# Patient Record
Sex: Female | Born: 1985 | Race: White | Hispanic: No | Smoking: Former smoker
Health system: Southern US, Community
[De-identification: ages and names within clinical notes are randomized; demographics above are authoritative.]

## PROBLEM LIST (undated history)

## (undated) HISTORY — PX: WISDOM TOOTH EXTRACTION: SHX21

---

## 2013-07-11 ENCOUNTER — Ambulatory Visit (INDEPENDENT_AMBULATORY_CARE_PROVIDER_SITE_OTHER): Payer: PRIVATE HEALTH INSURANCE | Admitting: Advanced Practice Midwife

## 2013-07-11 ENCOUNTER — Encounter: Payer: Self-pay | Admitting: Advanced Practice Midwife

## 2013-07-11 VITALS — BP 135/84 | Ht 66.0 in | Wt 149.0 lb

## 2013-07-11 DIAGNOSIS — Z124 Encounter for screening for malignant neoplasm of cervix: Secondary | ICD-10-CM

## 2013-07-11 DIAGNOSIS — O209 Hemorrhage in early pregnancy, unspecified: Secondary | ICD-10-CM

## 2013-07-11 DIAGNOSIS — Z113 Encounter for screening for infections with a predominantly sexual mode of transmission: Secondary | ICD-10-CM

## 2013-07-11 DIAGNOSIS — Z3400 Encounter for supervision of normal first pregnancy, unspecified trimester: Secondary | ICD-10-CM

## 2013-07-11 DIAGNOSIS — Z1151 Encounter for screening for human papillomavirus (HPV): Secondary | ICD-10-CM

## 2013-07-11 DIAGNOSIS — Z3401 Encounter for supervision of normal first pregnancy, first trimester: Secondary | ICD-10-CM

## 2013-07-11 NOTE — Progress Notes (Signed)
New OB.  Routines reviewed. OK with residents. See other note'  Subjective:    Meghan Delacruz is a G1P0 [redacted]w[redacted]d being seen today for her first obstetrical visit.  Her obstetrical history is significant for first trimerster spotting. Patient does intend to breast feed.  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 Pregnancy history fully reviewed.  Patient reports bleeding and no cramping.  Filed Vitals:   07/11/13 1721 07/11/13 1827  BP: 135/84   Height:  5\' 6"  (1.676 m)  Weight: 149 lb (67.586 kg)     HISTORY: OB History  Gravida Para Term Preterm AB SAB TAB Ectopic Multiple Living  1             # Outcome Date GA Lbr Len/2nd Weight Sex Delivery Anes PTL Lv  1 CUR              History reviewed. No pertinent past medical history. Past Surgical History  Procedure Laterality Date  . Wisdom tooth extraction     Family History  Problem Relation Age of Onset  . Mitral valve prolapse Mother   . Depression Mother   . Depression Father   . Asthma Father   . Asthma Sister   . Depression Sister     Post partum  . Chronic bronchitis Father   . Cancer - Lung Paternal Grandfather      Exam    Uterus:  Fundal Height: 5 cm  Pelvic Exam:    Perineum: No Hemorrhoids   Vulva: Bartholin's, Urethra, Skene's normal   Vagina:  normal discharge, scant blood   pH:    Cervix: nulliparous appearance   Adnexa: normal adnexa and no mass, fullness, tenderness   Bony Pelvis: gynecoid  System: Breast:  normal appearance, no masses or tenderness   Skin: normal coloration and turgor, no rashes    Neurologic: oriented, grossly non-focal   Extremities: normal strength, tone, and muscle mass   HEENT neck supple with midline trachea   Mouth/Teeth mucous membranes moist, pharynx normal without lesions   Neck supple and no masses   Cardiovascular: regular rate and rhythm, no murmurs or gallops   Respiratory:  appears well, vitals normal, no respiratory distress, acyanotic, normal RR, ear  and throat exam is normal, neck free of mass or lymphadenopathy, chest clear, no wheezing, crepitations, rhonchi, normal symmetric air entry   Abdomen: soft, non-tender; bowel sounds normal; no masses,  no organomegaly   Urinary: urethral meatus normal      Assessment:    Pregnancy: G1P0 Patient Active Problem List   Diagnosis Date Noted  . First trimester bleeding 07/11/2013        Plan:     Initial labs drawn. Prenatal vitamins. Problem list reviewed and updated. Genetic Screening discussed First Screen: requested.  Ultrasound discussed; fetal survey: requested.  Follow up in 1 weeks. 75% of 30 min visit spent on counseling and coordination of care.  Plan to repeat US per Lora in 7-10 days for viability.  OB panel done today   [facility] 07/11/2013

## 2013-07-11 NOTE — Patient Instructions (Signed)
Pregnancy - First Trimester  During sexual intercourse, millions of sperm go into the vagina. Only 1 sperm will penetrate and fertilize the female egg while it is in the Fallopian tube. One week later, the fertilized egg implants into the wall of the uterus. An embryo begins to develop into a baby. At 6 to 8 weeks, the eyes and face are formed and the heartbeat can be seen on ultrasound. At the end of 12 weeks (first trimester), all the baby's organs are formed. Now that you are pregnant, you will want to do everything you can to have a healthy baby. Two of the most important things are to get good prenatal care and follow your caregiver's instructions. Prenatal care is all the medical care you receive before the baby's birth. It is given to prevent, find, and treat problems during the pregnancy and childbirth.  PRENATAL EXAMS  · During prenatal visits, your weight, blood pressure, and urine are checked. This is done to make sure you are healthy and progressing normally during the pregnancy.  · A pregnant woman should gain 25 to 35 pounds during the pregnancy. However, if you are overweight or underweight, your caregiver will advise you regarding your weight.  · Your caregiver will ask and answer questions for you.  · Blood work, cervical cultures, other necessary tests, and a Pap test are done during your prenatal exams. These tests are done to check on your health and the probable health of your baby. Tests are strongly recommended and done for HIV with your permission. This is the virus that causes AIDS. These tests are done because medicines can be given to help prevent your baby from being born with this infection should you have been infected without knowing it. Blood work is also used to find out your blood type, previous infections, and follow your blood levels (hemoglobin).  · Low hemoglobin (anemia) is common during pregnancy. Iron and vitamins are given to help prevent this. Later in the pregnancy, blood  tests for diabetes will be done along with any other tests if any problems develop.  · You may need other tests to make sure you and the baby are doing well.  CHANGES DURING THE FIRST TRIMESTER   Your body goes through many changes during pregnancy. They vary from person to person. Talk to your caregiver about changes you notice and are concerned about. Changes can include:  · Your menstrual period stops.  · The egg and sperm carry the genes that determine what you look like. Genes from you and your partner are forming a baby. The female genes determine whether the baby is a boy or a girl.  · Your body increases in girth and you may feel bloated.  · Feeling sick to your stomach (nauseous) and throwing up (vomiting). If the vomiting is uncontrollable, call your caregiver.  · Your breasts will begin to enlarge and become tender.  · Your nipples may stick out more and become darker.  · The need to urinate more. Painful urination may mean you have a bladder infection.  · Tiring easily.  · Loss of appetite.  · Cravings for certain kinds of food.  · At first, you may gain or lose a couple of pounds.  · You may have changes in your emotions from day to day (excited to be pregnant or concerned something may go wrong with the pregnancy and baby).  · You may have more vivid and strange dreams.  HOME CARE INSTRUCTIONS   ·   It is very important to avoid all smoking, alcohol and non-prescribed drugs during your pregnancy. These affect the formation and growth of the baby. Avoid chemicals while pregnant to ensure the delivery of a healthy infant.  · Start your prenatal visits by the 12th week of pregnancy. They are usually scheduled monthly at first, then more often in the last 2 months before delivery. Keep your caregiver's appointments. Follow your caregiver's instructions regarding medicine use, blood and lab tests, exercise, and diet.  · During pregnancy, you are providing food for you and your baby. Eat regular, well-balanced  meals. Choose foods such as meat, fish, milk and other low fat dairy products, vegetables, fruits, and whole-grain breads and cereals. Your caregiver will tell you of the ideal weight gain.  · You can help morning sickness by keeping soda crackers at the bedside. Eat a couple before arising in the morning. You may want to use the crackers without salt on them.  · Eating 4 to 5 small meals rather than 3 large meals a day also may help the nausea and vomiting.  · Drinking liquids between meals instead of during meals also seems to help nausea and vomiting.  · A physical sexual relationship may be continued throughout pregnancy if there are no other problems. Problems may be early (premature) leaking of amniotic fluid from the membranes, vaginal bleeding, or belly (abdominal) pain.  · Exercise regularly if there are no restrictions. Check with your caregiver or physical therapist if you are unsure of the safety of some of your exercises. Greater weight gain will occur in the last 2 trimesters of pregnancy. Exercising will help:  · Control your weight.  · Keep you in shape.  · Prepare you for labor and delivery.  · Help you lose your pregnancy weight after you deliver your baby.  · Wear a good support or jogging bra for breast tenderness during pregnancy. This may help if worn during sleep too.  · Ask when prenatal classes are available. Begin classes when they are offered.  · Do not use hot tubs, steam rooms, or saunas.  · Wear your seat belt when driving. This protects you and your baby if you are in an accident.  · Avoid raw meat, uncooked cheese, cat litter boxes, and soil used by cats throughout the pregnancy. These carry germs that can cause birth defects in the baby.  · The first trimester is a good time to visit your dentist for your dental health. Getting your teeth cleaned is okay. Use a softer toothbrush and brush gently during pregnancy.  · Ask for help if you have financial, counseling, or nutritional needs  during pregnancy. Your caregiver will be able to offer counseling for these needs as well as refer you for other special needs.  · Do not take any medicines or herbs unless told by your caregiver.  · Inform your caregiver if there is any mental or physical domestic violence.  · Make a list of emergency phone numbers of family, friends, hospital, and police and fire departments.  · Write down your questions. Take them to your prenatal visit.  · Do not douche.  · Do not cross your legs.  · If you have to stand for long periods of time, rotate you feet or take small steps in a circle.  · You may have more vaginal secretions that may require a sanitary pad. Do not use tampons or scented sanitary pads.  MEDICINES AND DRUG USE IN PREGNANCY  ·   Take prenatal vitamins as directed. The vitamin should contain 1 milligram of folic acid. Keep all vitamins out of reach of children. Only a couple vitamins or tablets containing iron may be fatal to a baby or young child when ingested.  · Avoid use of all medicines, including herbs, over-the-counter medicines, not prescribed or suggested by your caregiver. Only take over-the-counter or prescription medicines for pain, discomfort, or fever as directed by your caregiver. Do not use aspirin, ibuprofen, or naproxen unless directed by your caregiver.  · Let your caregiver also know about herbs you may be using.  · Alcohol is related to a number of birth defects. This includes fetal alcohol syndrome. All alcohol, in any form, should be avoided completely. Smoking will cause low birth rate and premature babies.  · Street or illegal drugs are very harmful to the baby. They are absolutely forbidden. A baby born to an addicted mother will be addicted at birth. The baby will go through the same withdrawal an adult does.  · Let your caregiver know about any medicines that you have to take and for what reason you take them.  SEEK MEDICAL CARE IF:   You have any concerns or worries during your  pregnancy. It is better to call with your questions if you feel they cannot wait, rather than worry about them.  SEEK IMMEDIATE MEDICAL CARE IF:   · An unexplained oral temperature above 102° F (38.9° C) develops, or as your caregiver suggests.  · You have leaking of fluid from the vagina (birth canal). If leaking membranes are suspected, take your temperature and inform your caregiver of this when you call.  · There is vaginal spotting or bleeding. Notify your caregiver of the amount and how many pads are used.  · You develop a bad smelling vaginal discharge with a change in the color.  · You continue to feel sick to your stomach (nauseated) and have no relief from remedies suggested. You vomit blood or coffee ground-like materials.  · You lose more than 2 pounds of weight in 1 week.  · You gain more than 2 pounds of weight in 1 week and you notice swelling of your face, hands, feet, or legs.  · You gain 5 pounds or more in 1 week (even if you do not have swelling of your hands, face, legs, or feet).  · You get exposed to German measles and have never had them.  · You are exposed to fifth disease or chickenpox.  · You develop belly (abdominal) pain. Round ligament discomfort is a common non-cancerous (benign) cause of abdominal pain in pregnancy. Your caregiver still must evaluate this.  · You develop headache, fever, diarrhea, pain with urination, or shortness of breath.  · You fall or are in a car accident or have any kind of trauma.  · There is mental or physical violence in your home.  Document Released: 08/17/2001 Document Revised: 05/17/2012 Document Reviewed: 02/18/2009  ExitCare® Patient Information ©2014 [employer].

## 2013-07-11 NOTE — Progress Notes (Signed)
p-106  Bedside U/S showed IUP with fetal pole of 2.3 mm and GA of [redacted]w[redacted]d.  No FHT seen.  She does have a small subchorionic hemorrhage.

## 2013-07-12 LAB — OBSTETRIC PANEL
Antibody Screen: NEGATIVE
Eosinophils Absolute: 0.2 10*3/uL (ref 0.0–0.7)
Hemoglobin: 13.2 g/dL (ref 12.0–15.0)
Hepatitis B Surface Ag: NEGATIVE
Lymphs Abs: 1.9 10*3/uL (ref 0.7–4.0)
MCH: 30.6 pg (ref 26.0–34.0)
Monocytes Absolute: 0.8 10*3/uL (ref 0.1–1.0)
Monocytes Relative: 9 % (ref 3–12)
Neutrophils Relative %: 66 % (ref 43–77)
RBC: 4.31 MIL/uL (ref 3.87–5.11)
RDW: 12.5 % (ref 11.5–15.5)
Rh Type: POSITIVE
Rubella: 6.14 Index — ABNORMAL HIGH (ref <0.90)
WBC: 8.8 10*3/uL (ref 4.0–10.5)

## 2013-07-12 LAB — HIV ANTIBODY (ROUTINE TESTING W REFLEX): HIV: NONREACTIVE

## 2013-07-12 LAB — URINE CULTURE
Colony Count: NO GROWTH
Organism ID, Bacteria: NO GROWTH

## 2013-07-14 ENCOUNTER — Encounter: Payer: Self-pay | Admitting: Advanced Practice Midwife

## 2013-07-14 DIAGNOSIS — B977 Papillomavirus as the cause of diseases classified elsewhere: Secondary | ICD-10-CM | POA: Insufficient documentation

## 2013-07-20 ENCOUNTER — Encounter: Payer: Self-pay | Admitting: Family

## 2013-07-20 ENCOUNTER — Ambulatory Visit (INDEPENDENT_AMBULATORY_CARE_PROVIDER_SITE_OTHER): Payer: PRIVATE HEALTH INSURANCE | Admitting: Family

## 2013-07-20 VITALS — BP 139/88 | Temp 97.9°F

## 2013-07-20 DIAGNOSIS — Z3491 Encounter for supervision of normal pregnancy, unspecified, first trimester: Secondary | ICD-10-CM

## 2013-07-20 DIAGNOSIS — Z3400 Encounter for supervision of normal first pregnancy, unspecified trimester: Secondary | ICD-10-CM

## 2013-07-20 NOTE — Progress Notes (Signed)
p-109  Bedside U/S showed viable IUP  With FHT of 124.

## 2013-07-20 NOTE — Progress Notes (Signed)
Nurse only visit

## 2013-08-23 ENCOUNTER — Ambulatory Visit (INDEPENDENT_AMBULATORY_CARE_PROVIDER_SITE_OTHER): Payer: PRIVATE HEALTH INSURANCE | Admitting: Obstetrics & Gynecology

## 2013-08-23 VITALS — BP 126/80 | Wt 151.0 lb

## 2013-08-23 DIAGNOSIS — Z3491 Encounter for supervision of normal pregnancy, unspecified, first trimester: Secondary | ICD-10-CM

## 2013-08-23 DIAGNOSIS — Z23 Encounter for immunization: Secondary | ICD-10-CM

## 2013-08-23 DIAGNOSIS — Z3480 Encounter for supervision of other normal pregnancy, unspecified trimester: Secondary | ICD-10-CM

## 2013-08-23 MED ORDER — INFLUENZA VAC SPLIT QUAD 0.5 ML IM SUSP: 0.5000 mL | Freq: Once | INTRAMUSCULAR | Status: DC

## 2013-08-23 NOTE — Progress Notes (Signed)
p-123

## 2013-08-23 NOTE — Progress Notes (Signed)
No problems.  Mild tiredness.  No nausea.  First screen ordered. Flu shot.

## 2013-09-06 NOTE — L&D Delivery Note (Signed)
Delivery Note At 7:03 PM a viable female was delivered via Vaginal, Spontaneous Delivery (Presentation: Left Occiput Anterior).  APGAR: 8, 9; weight pending .   Placenta status: Intact, Spontaneous.  Cord: 3 vessels with the following complications: None.    Anesthesia: None Episiotomy: None Lacerations: Periurethral;Sulcus second degree Suture Repair: 3.0 vicryl Est. Blood Loss (mL): 350  Mom to postpartum.  Baby to Couplet care / Skin to Skin.  Called to delivery. Mother pushed over 20 minutes. Infant delivered to maternal abdomen. Cord clamped and cut. Active management of 3rd stage with traction. Placenta delivered intact with 3v cord. Second degree tear repaired with 3.0 monocryl on CT in usual manner. [id number]. Counts correct. Hemostatic.   Yolande Jollyaleb G Kynnedi Zweig, MD Resident    Gwenn Teodoro, Hillery Hunteraleb G 03/09/2014, 8:16 PM

## 2013-09-21 ENCOUNTER — Encounter: Payer: Self-pay | Admitting: Obstetrics and Gynecology

## 2013-09-21 ENCOUNTER — Ambulatory Visit (INDEPENDENT_AMBULATORY_CARE_PROVIDER_SITE_OTHER): Payer: PRIVATE HEALTH INSURANCE | Admitting: Obstetrics and Gynecology

## 2013-09-21 VITALS — BP 133/88 | Wt 152.0 lb

## 2013-09-21 DIAGNOSIS — Z3402 Encounter for supervision of normal first pregnancy, second trimester: Secondary | ICD-10-CM

## 2013-09-21 DIAGNOSIS — Z3400 Encounter for supervision of normal first pregnancy, unspecified trimester: Secondary | ICD-10-CM

## 2013-09-21 NOTE — Progress Notes (Signed)
p - 111

## 2013-09-21 NOTE — Patient Instructions (Signed)
Second Trimester of Pregnancy The second trimester is from week 13 through week 28, months 4 through 6. The second trimester is often a time when you feel your best. Your body has also adjusted to being pregnant, and you begin to feel better physically. Usually, morning sickness has lessened or quit completely, you may have more energy, and you may have an increase in appetite. The second trimester is also a time when the fetus is growing rapidly. At the end of the sixth month, the fetus is about 9 inches long and weighs about 1 pounds. You will likely begin to feel the baby move (quickening) between 18 and 20 weeks of the pregnancy. BODY CHANGES Your body goes through many changes during pregnancy. The changes vary from woman to woman.   Your weight will continue to increase. You will notice your lower abdomen bulging out.  You may begin to get stretch marks on your hips, abdomen, and breasts.  You may develop headaches that can be relieved by medicines approved by your caregiver.  You may urinate more often because the fetus is pressing on your bladder.  You may develop or continue to have heartburn as a result of your pregnancy.  You may develop constipation because certain hormones are causing the muscles that push waste through your intestines to slow down.  You may develop hemorrhoids or swollen, bulging veins (varicose veins).  You may have back pain because of the weight gain and pregnancy hormones relaxing your joints between the bones in your pelvis and as a result of a shift in weight and the muscles that support your balance.  Your breasts will continue to grow and be tender.  Your gums may bleed and may be sensitive to brushing and flossing.  Dark spots or blotches (chloasma, mask of pregnancy) may develop on your face. This will likely fade after the baby is born.  A dark line from your belly button to the pubic area (linea nigra) may appear. This will likely fade after the  baby is born. WHAT TO EXPECT AT YOUR PRENATAL VISITS During a routine prenatal visit:  You will be weighed to make sure you and the fetus are growing normally.  Your blood pressure will be taken.  Your abdomen will be measured to track your baby's growth.  The fetal heartbeat will be listened to.  Any test results from the previous visit will be discussed. Your caregiver may ask you:  How you are feeling.  If you are feeling the baby move.  If you have had any abnormal symptoms, such as leaking fluid, bleeding, severe headaches, or abdominal cramping.  If you have any questions. Other tests that may be performed during your second trimester include:  Blood tests that check for:  Low iron levels (anemia).  Gestational diabetes (between 24 and 28 weeks).  Rh antibodies.  Urine tests to check for infections, diabetes, or protein in the urine.  An ultrasound to confirm the proper growth and development of the baby.  An amniocentesis to check for possible genetic problems.  Fetal screens for spina bifida and Down syndrome. HOME CARE INSTRUCTIONS   Avoid all smoking, herbs, alcohol, and unprescribed drugs. These chemicals affect the formation and growth of the baby.  Follow your caregiver's instructions regarding medicine use. There are medicines that are either safe or unsafe to take during pregnancy.  Exercise only as directed by your caregiver. Experiencing uterine cramps is a good sign to stop exercising.  Continue to eat regular,   healthy meals.  Wear a good support bra for breast tenderness.  Do not use hot tubs, steam rooms, or saunas.  Wear your seat belt at all times when driving.  Avoid raw meat, uncooked cheese, cat litter boxes, and soil used by cats. These carry germs that can cause birth defects in the baby.  Take your prenatal vitamins.  Try taking a stool softener (if your caregiver approves) if you develop constipation. Eat more high-fiber foods,  such as fresh vegetables or fruit and whole grains. Drink plenty of fluids to keep your urine clear or pale yellow.  Take warm sitz baths to soothe any pain or discomfort caused by hemorrhoids. Use hemorrhoid cream if your caregiver approves.  If you develop varicose veins, wear support hose. Elevate your feet for 15 minutes, 3 4 times a day. Limit salt in your diet.  Avoid heavy lifting, wear low heel shoes, and practice good posture.  Rest with your legs elevated if you have leg cramps or low back pain.  Visit your dentist if you have not gone yet during your pregnancy. Use a soft toothbrush to brush your teeth and be gentle when you floss.  A sexual relationship may be continued unless your caregiver directs you otherwise.  Continue to go to all your prenatal visits as directed by your caregiver. SEEK MEDICAL CARE IF:   You have dizziness.  You have mild pelvic cramps, pelvic pressure, or nagging pain in the abdominal area.  You have persistent nausea, vomiting, or diarrhea.  You have a bad smelling vaginal discharge.  You have pain with urination. SEEK IMMEDIATE MEDICAL CARE IF:   You have a fever.  You are leaking fluid from your vagina.  You have spotting or bleeding from your vagina.  You have severe abdominal cramping or pain.  You have rapid weight gain or loss.  You have shortness of breath with chest pain.  You notice sudden or extreme swelling of your face, hands, ankles, feet, or legs.  You have not felt your baby move in over an hour.  You have severe headaches that do not go away with medicine.  You have vision changes. Document Released: 08/17/2001 Document Revised: 04/25/2013 Document Reviewed: 10/24/2012 ExitCare Patient Information 2014 [employer].  

## 2013-09-21 NOTE — Progress Notes (Signed)
No VB or UTI sx> urine culture. 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  Anatomic US ordered Watch BPs

## 2013-10-19 ENCOUNTER — Ambulatory Visit (HOSPITAL_COMMUNITY)
Admission: RE | Admit: 2013-10-19 | Discharge: 2013-10-19 | Disposition: A | Discharge status: Home / Self Care | Payer: PRIVATE HEALTH INSURANCE | Source: Ambulatory Visit | Attending: Obstetrics and Gynecology | Admitting: Obstetrics and Gynecology

## 2013-10-19 DIAGNOSIS — Z3689 Encounter for other specified antenatal screening: Secondary | ICD-10-CM | POA: Insufficient documentation

## 2013-10-19 DIAGNOSIS — Z3402 Encounter for supervision of normal first pregnancy, second trimester: Secondary | ICD-10-CM

## 2013-10-19 DIAGNOSIS — Z3492 Encounter for supervision of normal pregnancy, unspecified, second trimester: Secondary | ICD-10-CM

## 2013-10-22 ENCOUNTER — Encounter: Payer: Self-pay | Admitting: Advanced Practice Midwife

## 2013-10-22 ENCOUNTER — Ambulatory Visit (INDEPENDENT_AMBULATORY_CARE_PROVIDER_SITE_OTHER): Payer: PRIVATE HEALTH INSURANCE | Admitting: Advanced Practice Midwife

## 2013-10-22 VITALS — BP 125/76 | Wt 163.0 lb

## 2013-10-22 DIAGNOSIS — Z3480 Encounter for supervision of other normal pregnancy, unspecified trimester: Secondary | ICD-10-CM

## 2013-10-22 DIAGNOSIS — Z3492 Encounter for supervision of normal pregnancy, unspecified, second trimester: Secondary | ICD-10-CM

## 2013-10-22 NOTE — Progress Notes (Signed)
p-111  Pt declines Quad screen

## 2013-10-22 NOTE — Progress Notes (Signed)
Anatomy scan reviewed. Unsure if planning F/U.

## 2013-10-22 NOTE — Patient Instructions (Signed)
Second Trimester of Pregnancy The second trimester is from week 13 through week 28, months 4 through 6. The second trimester is often a time when you feel your best. Your body has also adjusted to being pregnant, and you begin to feel better physically. Usually, morning sickness has lessened or quit completely, you may have more energy, and you may have an increase in appetite. The second trimester is also a time when the fetus is growing rapidly. At the end of the sixth month, the fetus is about 9 inches long and weighs about 1 pounds. You will likely begin to feel the baby move (quickening) between 18 and 20 weeks of the pregnancy. BODY CHANGES Your body goes through many changes during pregnancy. The changes vary from woman to woman.   Your weight will continue to increase. You will notice your lower abdomen bulging out.  You may begin to get stretch marks on your hips, abdomen, and breasts.  You may develop headaches that can be relieved by medicines approved by your caregiver.  You may urinate more often because the fetus is pressing on your bladder.  You may develop or continue to have heartburn as a result of your pregnancy.  You may develop constipation because certain hormones are causing the muscles that push waste through your intestines to slow down.  You may develop hemorrhoids or swollen, bulging veins (varicose veins).  You may have back pain because of the weight gain and pregnancy hormones relaxing your joints between the bones in your pelvis and as a result of a shift in weight and the muscles that support your balance.  Your breasts will continue to grow and be tender.  Your gums may bleed and may be sensitive to brushing and flossing.  Dark spots or blotches (chloasma, mask of pregnancy) may develop on your face. This will likely fade after the baby is born.  A dark line from your belly button to the pubic area (linea nigra) may appear. This will likely fade after  the baby is born. WHAT TO EXPECT AT YOUR PRENATAL VISITS During a routine prenatal visit:  You will be weighed to make sure you and the fetus are growing normally.  Your blood pressure will be taken.  Your abdomen will be measured to track your baby's growth.  The fetal heartbeat will be listened to.  Any test results from the previous visit will be discussed. Your caregiver may ask you:  How you are feeling.  If you are feeling the baby move.  If you have had any abnormal symptoms, such as leaking fluid, bleeding, severe headaches, or abdominal cramping.  If you have any questions. Other tests that may be performed during your second trimester include:  Blood tests that check for:  Low iron levels (anemia).  Gestational diabetes (between 24 and 28 weeks).  Rh antibodies.  Urine tests to check for infections, diabetes, or protein in the urine.  An ultrasound to confirm the proper growth and development of the baby.  An amniocentesis to check for possible genetic problems.  Fetal screens for spina bifida and Down syndrome. HOME CARE INSTRUCTIONS   Avoid all smoking, herbs, alcohol, and unprescribed drugs. These chemicals affect the formation and growth of the baby.  Follow your caregiver's instructions regarding medicine use. There are medicines that are either safe or unsafe to take during pregnancy.  Exercise only as directed by your caregiver. Experiencing uterine cramps is a good sign to stop exercising.  Continue to eat regular,   healthy meals.  Wear a good support bra for breast tenderness.  Do not use hot tubs, steam rooms, or saunas.  Wear your seat belt at all times when driving.  Avoid raw meat, uncooked cheese, cat litter boxes, and soil used by cats. These carry germs that can cause birth defects in the baby.  Take your prenatal vitamins.  Try taking a stool softener (if your caregiver approves) if you develop constipation. Eat more high-fiber  foods, such as fresh vegetables or fruit and whole grains. Drink plenty of fluids to keep your urine clear or pale yellow.  Take warm sitz baths to soothe any pain or discomfort caused by hemorrhoids. Use hemorrhoid cream if your caregiver approves.  If you develop varicose veins, wear support hose. Elevate your feet for 15 minutes, 3 4 times a day. Limit salt in your diet.  Avoid heavy lifting, wear low heel shoes, and practice good posture.  Rest with your legs elevated if you have leg cramps or low back pain.  Visit your dentist if you have not gone yet during your pregnancy. Use a soft toothbrush to brush your teeth and be gentle when you floss.  A sexual relationship may be continued unless your caregiver directs you otherwise.  Continue to go to all your prenatal visits as directed by your caregiver. SEEK MEDICAL CARE IF:   You have dizziness.  You have mild pelvic cramps, pelvic pressure, or nagging pain in the abdominal area.  You have persistent nausea, vomiting, or diarrhea.  You have a bad smelling vaginal discharge.  You have pain with urination. SEEK IMMEDIATE MEDICAL CARE IF:   You have a fever.  You are leaking fluid from your vagina.  You have spotting or bleeding from your vagina.  You have severe abdominal cramping or pain.  You have rapid weight gain or loss.  You have shortness of breath with chest pain.  You notice sudden or extreme swelling of your face, hands, ankles, feet, or legs.  You have not felt your baby move in over an hour.  You have severe headaches that do not go away with medicine.  You have vision changes. Document Released: 08/17/2001 Document Revised: 04/25/2013 Document Reviewed: 10/24/2012 [employer] Patient Information 2014 [employer].  Breastfeeding Deciding to breastfeed is one of the best choices you can make for you and your baby. A change in hormones during pregnancy causes your breast tissue to grow and increases the  number and size of your milk ducts. These hormones also allow proteins, sugars, and fats from your blood supply to make breast milk in your milk-producing glands. Hormones prevent breast milk from being released before your baby is born as well as prompt milk flow after birth. Once breastfeeding has begun, thoughts of your baby, as well as his or her sucking or crying, can stimulate the release of milk from your milk-producing glands.  BENEFITS OF BREASTFEEDING For Your Baby  Your first milk (colostrum) helps your baby's digestive system function better.   There are antibodies in your milk that help your baby fight off infections.   Your baby has a lower incidence of asthma, allergies, and sudden infant death syndrome.   The nutrients in breast milk are better for your baby than infant formulas and are designed uniquely for your baby's needs.   Breast milk improves your baby's brain development.   Your baby is less likely to develop other conditions, such as childhood obesity, asthma, or type 2 diabetes mellitus.  For   You   Breastfeeding helps to create a very special bond between you and your baby.   Breastfeeding is convenient. Breast milk is always available at the correct temperature and costs nothing.   Breastfeeding helps to burn calories and helps you lose the weight gained during pregnancy.   Breastfeeding makes your uterus contract to its prepregnancy size faster and slows bleeding (lochia) after you give birth.   Breastfeeding helps to lower your risk of developing type 2 diabetes mellitus, osteoporosis, and breast or ovarian cancer later in life. SIGNS THAT YOUR BABY IS HUNGRY Early Signs of Hunger  Increased alertness or activity.  Stretching.  Movement of the head from side to side.  Movement of the head and opening of the mouth when the corner of the mouth or cheek is stroked (rooting).  Increased sucking sounds, smacking lips, cooing, sighing, or  squeaking.  Hand-to-mouth movements.  Increased sucking of fingers or hands. Late Signs of Hunger  Fussing.  Intermittent crying. Extreme Signs of Hunger Signs of extreme hunger will require calming and consoling before your baby will be able to breastfeed successfully. Do not wait for the following signs of extreme hunger to occur before you initiate breastfeeding:   Restlessness.  A loud, strong cry.   Screaming. BREASTFEEDING BASICS Breastfeeding Initiation  Find a comfortable place to sit or lie down, with your neck and back well supported.  Place a pillow or rolled up blanket under your baby to bring him or her to the level of your breast (if you are seated). Nursing pillows are specially designed to help support your arms and your baby while you breastfeed.  Make sure that your baby's abdomen is facing your abdomen.   Gently massage your breast. With your fingertips, massage from your chest wall toward your nipple in a circular motion. This encourages milk flow. You may need to continue this action during the feeding if your milk flows slowly.  Support your breast with 4 fingers underneath and your thumb above your nipple. Make sure your fingers are well away from your nipple and your baby's mouth.   Stroke your baby's lips gently with your finger or nipple.   When your baby's mouth is open wide enough, quickly bring your baby to your breast, placing your entire nipple and as much of the colored area around your nipple (areola) as possible into your baby's mouth.   More areola should be visible above your baby's upper lip than below the lower lip.   Your baby's tongue should be between his or her lower gum and your breast.   Ensure that your baby's mouth is correctly positioned around your nipple (latched). Your baby's lips should create a seal on your breast and be turned out (everted).  It is common for your baby to suck about 2 3 minutes in order to start the  flow of breast milk. Latching Teaching your baby how to latch on to your breast properly is very important. An improper latch can cause nipple pain and decreased milk supply for you and poor weight gain in your baby. Also, if your baby is not latched onto your nipple properly, he or she may swallow some air during feeding. This can make your baby fussy. Burping your baby when you switch breasts during the feeding can help to get rid of the air. However, teaching your baby to latch on properly is still the best way to prevent fussiness from swallowing air while breastfeeding. Signs that your baby has   successfully latched on to your nipple:    Silent tugging or silent sucking, without causing you pain.   Swallowing heard between every 3 4 sucks.    Muscle movement above and in front of his or her ears while sucking.  Signs that your baby has not successfully latched on to nipple:   Sucking sounds or smacking sounds from your baby while breastfeeding.  Nipple pain. If you think your baby has not latched on correctly, slip your finger into the corner of your baby's mouth to break the suction and place it between your baby's gums. Attempt breastfeeding initiation again. Signs of Successful Breastfeeding Signs from your baby:   A gradual decrease in the number of sucks or complete cessation of sucking.   Falling asleep.   Relaxation of his or her body.   Retention of a small amount of milk in his or her mouth.   Letting go of your breast by himself or herself. Signs from you:  Breasts that have increased in firmness, weight, and size 1 3 hours after feeding.   Breasts that are softer immediately after breastfeeding.  Increased milk volume, as well as a change in milk consistency and color by the 5th day of breastfeeding.   Nipples that are not sore, cracked, or bleeding. Signs That Your Baby is Getting Enough Milk  Wetting at least 3 diapers in a 24-hour period. The urine  should be clear and pale yellow by age 5 days.  At least 3 stools in a 24-hour period by age 5 days. The stool should be soft and yellow.  At least 3 stools in a 24-hour period by age 7 days. The stool should be seedy and yellow.  No loss of weight greater than 10% of birth weight during the first 3 days of age.  Average weight gain of 4 7 ounces (120 210 mL) per week after age 4 days.  Consistent daily weight gain by age 5 days, without weight loss after the age of 2 weeks. After a feeding, your baby may spit up a small amount. This is common. BREASTFEEDING FREQUENCY AND DURATION Frequent feeding will help you make more milk and can prevent sore nipples and breast engorgement. Breastfeed when you feel the need to reduce the fullness of your breasts or when your baby shows signs of hunger. This is called "breastfeeding on demand." Avoid introducing a pacifier to your baby while you are working to establish breastfeeding (the first 4 6 weeks after your baby is born). After this time you may choose to use a pacifier. Research has shown that pacifier use during the first year of a baby's life decreases the risk of sudden infant death syndrome (SIDS). Allow your baby to feed on each breast as long as he or she wants. Breastfeed until your baby is finished feeding. When your baby unlatches or falls asleep while feeding from the first breast, offer the second breast. Because newborns are often sleepy in the first few weeks of life, you may need to awaken your baby to get him or her to feed. Breastfeeding times will vary from baby to baby. However, the following rules can serve as a guide to help you ensure that your baby is properly fed:  Newborns (babies 4 weeks of age or younger) may breastfeed every 1 3 hours.  Newborns should not go longer than 3 hours during the day or 5 hours during the night without breastfeeding.  You should breastfeed your baby a minimum of   8 times in a 24-hour period until  you begin to introduce solid foods to your baby at around 6 months of age. BREAST MILK PUMPING Pumping and storing breast milk allows you to ensure that your baby is exclusively fed your breast milk, even at times when you are unable to breastfeed. This is especially important if you are going back to work while you are still breastfeeding or when you are not able to be present during feedings. Your lactation consultant can give you guidelines on how long it is safe to store breast milk.  A breast pump is a machine that allows you to pump milk from your breast into a sterile bottle. The pumped breast milk can then be stored in a refrigerator or freezer. Some breast pumps are operated by hand, while others use electricity. Ask your lactation consultant which type will work best for you. Breast pumps can be purchased, but some hospitals and breastfeeding support groups lease breast pumps on a monthly basis. A lactation consultant can teach you how to hand express breast milk, if you prefer not to use a pump.  CARING FOR YOUR BREASTS WHILE YOU BREASTFEED Nipples can become dry, cracked, and sore while breastfeeding. The following recommendations can help keep your breasts moisturized and healthy:  Avoid using soap on your nipples.   Wear a supportive bra. Although not required, special nursing bras and tank tops are designed to allow access to your breasts for breastfeeding without taking off your entire bra or top. Avoid wearing underwire style bras or extremely tight bras.  Air dry your nipples for 3 4minutes after each feeding.   Use only cotton bra pads to absorb leaked breast milk. Leaking of breast milk between feedings is normal.   Use lanolin on your nipples after breastfeeding. Lanolin helps to maintain your skin's normal moisture barrier. If you use pure lanolin you do not need to wash it off before feeding your baby again. Pure lanolin is not toxic to your baby. You may also hand express a  few drops of breast milk and gently massage that milk into your nipples and allow the milk to air dry. In the first few weeks after giving birth, some women experience extremely full breasts (engorgement). Engorgement can make your breasts feel heavy, warm, and tender to the touch. Engorgement peaks within 3 5 days after you give birth. The following recommendations can help ease engorgement:  Completely empty your breasts while breastfeeding or pumping. You may want to start by applying warm, moist heat (in the shower or with warm water-soaked hand towels) just before feeding or pumping. This increases circulation and helps the milk flow. If your baby does not completely empty your breasts while breastfeeding, pump any extra milk after he or she is finished.  Wear a snug bra (nursing or regular) or tank top for 1 2 days to signal your body to slightly decrease milk production.  Apply ice packs to your breasts, unless this is too uncomfortable for you.  Make sure that your baby is latched on and positioned properly while breastfeeding. If engorgement persists after 48 hours of following these recommendations, contact your health care provider or a lactation consultant. OVERALL HEALTH CARE RECOMMENDATIONS WHILE BREASTFEEDING  Eat healthy foods. Alternate between meals and snacks, eating 3 of each per day. Because what you eat affects your breast milk, some of the foods may make your baby more irritable than usual. Avoid eating these foods if you are sure that they are   negatively affecting your baby.  Drink milk, fruit juice, and water to satisfy your thirst (about 10 glasses a day).   Rest often, relax, and continue to take your prenatal vitamins to prevent fatigue, stress, and anemia.  Continue breast self-awareness checks.  Avoid chewing and smoking tobacco.  Avoid alcohol and drug use. Some medicines that may be harmful to your baby can pass through breast milk. It is important to ask your  health care provider before taking any medicine, including all over-the-counter and prescription medicine as well as vitamin and herbal supplements. It is possible to become pregnant while breastfeeding. If birth control is desired, ask your health care provider about options that will be safe for your baby. SEEK MEDICAL CARE IF:   You feel like you want to stop breastfeeding or have become frustrated with breastfeeding.  You have painful breasts or nipples.  Your nipples are cracked or bleeding.  Your breasts are red, tender, or warm.  You have a swollen area on either breast.  You have a fever or chills.  You have nausea or vomiting.  You have drainage other than breast milk from your nipples.  Your breasts do not become full before feedings by the 5th day after you give birth.  You feel sad and depressed.  Your baby is too sleepy to eat well.  Your baby is having trouble sleeping.   Your baby is wetting less than 3 diapers in a 24-hour period.  Your baby has less than 3 stools in a 24-hour period.  Your baby's skin or the white part of his or her eyes becomes yellow.   Your baby is not gaining weight by 5 days of age. SEEK IMMEDIATE MEDICAL CARE IF:   Your baby is overly tired (lethargic) and does not want to wake up and feed.  Your baby develops an unexplained fever. Document Released: 08/23/2005 Document Revised: 04/25/2013 Document Reviewed: 02/14/2013 [employer] Patient Information 2014 [employer].  

## 2013-11-16 ENCOUNTER — Ambulatory Visit (INDEPENDENT_AMBULATORY_CARE_PROVIDER_SITE_OTHER): Payer: PRIVATE HEALTH INSURANCE | Admitting: Advanced Practice Midwife

## 2013-11-16 ENCOUNTER — Encounter: Payer: Self-pay | Admitting: Advanced Practice Midwife

## 2013-11-16 VITALS — BP 123/84 | Wt 164.0 lb

## 2013-11-16 DIAGNOSIS — Z3492 Encounter for supervision of normal pregnancy, unspecified, second trimester: Secondary | ICD-10-CM

## 2013-11-16 DIAGNOSIS — Z3480 Encounter for supervision of other normal pregnancy, unspecified trimester: Secondary | ICD-10-CM

## 2013-11-16 NOTE — Progress Notes (Signed)
Doing well. Some foot cramping at night. Discussed stretching exercises.

## 2013-11-16 NOTE — Progress Notes (Signed)
P-[id number] 

## 2013-11-16 NOTE — Patient Instructions (Signed)
Second Trimester of Pregnancy The second trimester is from week 13 through week 28, months 4 through 6. The second trimester is often a time when you feel your best. Your body has also adjusted to being pregnant, and you begin to feel better physically. Usually, morning sickness has lessened or quit completely, you may have more energy, and you may have an increase in appetite. The second trimester is also a time when the fetus is growing rapidly. At the end of the sixth month, the fetus is about 9 inches long and weighs about 1 pounds. You will likely begin to feel the baby move (quickening) between 18 and 20 weeks of the pregnancy. BODY CHANGES Your body goes through many changes during pregnancy. The changes vary from woman to woman.   Your weight will continue to increase. You will notice your lower abdomen bulging out.  You may begin to get stretch marks on your hips, abdomen, and breasts.  You may develop headaches that can be relieved by medicines approved by your caregiver.  You may urinate more often because the fetus is pressing on your bladder.  You may develop or continue to have heartburn as a result of your pregnancy.  You may develop constipation because certain hormones are causing the muscles that push waste through your intestines to slow down.  You may develop hemorrhoids or swollen, bulging veins (varicose veins).  You may have back pain because of the weight gain and pregnancy hormones relaxing your joints between the bones in your pelvis and as a result of a shift in weight and the muscles that support your balance.  Your breasts will continue to grow and be tender.  Your gums may bleed and may be sensitive to brushing and flossing.  Dark spots or blotches (chloasma, mask of pregnancy) may develop on your face. This will likely fade after the baby is born.  A dark line from your belly button to the pubic area (linea nigra) may appear. This will likely fade after the  baby is born. WHAT TO EXPECT AT YOUR PRENATAL VISITS During a routine prenatal visit:  You will be weighed to make sure you and the fetus are growing normally.  Your blood pressure will be taken.  Your abdomen will be measured to track your baby's growth.  The fetal heartbeat will be listened to.  Any test results from the previous visit will be discussed. Your caregiver may ask you:  How you are feeling.  If you are feeling the baby move.  If you have had any abnormal symptoms, such as leaking fluid, bleeding, severe headaches, or abdominal cramping.  If you have any questions. Other tests that may be performed during your second trimester include:  Blood tests that check for:  Low iron levels (anemia).  Gestational diabetes (between 24 and 28 weeks).  Rh antibodies.  Urine tests to check for infections, diabetes, or protein in the urine.  An ultrasound to confirm the proper growth and development of the baby.  An amniocentesis to check for possible genetic problems.  Fetal screens for spina bifida and Down syndrome. HOME CARE INSTRUCTIONS   Avoid all smoking, herbs, alcohol, and unprescribed drugs. These chemicals affect the formation and growth of the baby.  Follow your caregiver's instructions regarding medicine use. There are medicines that are either safe or unsafe to take during pregnancy.  Exercise only as directed by your caregiver. Experiencing uterine cramps is a good sign to stop exercising.  Continue to eat regular,   healthy meals.  Wear a good support bra for breast tenderness.  Do not use hot tubs, steam rooms, or saunas.  Wear your seat belt at all times when driving.  Avoid raw meat, uncooked cheese, cat litter boxes, and soil used by cats. These carry germs that can cause birth defects in the baby.  Take your prenatal vitamins.  Try taking a stool softener (if your caregiver approves) if you develop constipation. Eat more high-fiber foods,  such as fresh vegetables or fruit and whole grains. Drink plenty of fluids to keep your urine clear or pale yellow.  Take warm sitz baths to soothe any pain or discomfort caused by hemorrhoids. Use hemorrhoid cream if your caregiver approves.  If you develop varicose veins, wear support hose. Elevate your feet for 15 minutes, 3 4 times a day. Limit salt in your diet.  Avoid heavy lifting, wear low heel shoes, and practice good posture.  Rest with your legs elevated if you have leg cramps or low back pain.  Visit your dentist if you have not gone yet during your pregnancy. Use a soft toothbrush to brush your teeth and be gentle when you floss.  A sexual relationship may be continued unless your caregiver directs you otherwise.  Continue to go to all your prenatal visits as directed by your caregiver. SEEK MEDICAL CARE IF:   You have dizziness.  You have mild pelvic cramps, pelvic pressure, or nagging pain in the abdominal area.  You have persistent nausea, vomiting, or diarrhea.  You have a bad smelling vaginal discharge.  You have pain with urination. SEEK IMMEDIATE MEDICAL CARE IF:   You have a fever.  You are leaking fluid from your vagina.  You have spotting or bleeding from your vagina.  You have severe abdominal cramping or pain.  You have rapid weight gain or loss.  You have shortness of breath with chest pain.  You notice sudden or extreme swelling of your face, hands, ankles, feet, or legs.  You have not felt your baby move in over an hour.  You have severe headaches that do not go away with medicine.  You have vision changes. Document Released: 08/17/2001 Document Revised: 04/25/2013 Document Reviewed: 10/24/2012 ExitCare Patient Information 2014 [employer].  

## 2013-12-12 ENCOUNTER — Encounter: Payer: Self-pay | Admitting: *Deleted

## 2013-12-14 ENCOUNTER — Ambulatory Visit (INDEPENDENT_AMBULATORY_CARE_PROVIDER_SITE_OTHER): Payer: PRIVATE HEALTH INSURANCE | Admitting: Advanced Practice Midwife

## 2013-12-14 VITALS — BP 127/83 | Wt 166.0 lb

## 2013-12-14 DIAGNOSIS — Z3400 Encounter for supervision of normal first pregnancy, unspecified trimester: Secondary | ICD-10-CM

## 2013-12-14 LAB — RPR

## 2013-12-14 LAB — HIV ANTIBODY (ROUTINE TESTING W REFLEX): HIV 1&2 Ab, 4th Generation: NONREACTIVE

## 2013-12-14 NOTE — Progress Notes (Signed)
Doing well.  Good fetal movement, denies vaginal bleeding, LOF, regular contractions.  Glucola today.  Mild edema in one lower extremity a few days ago, resolved easily.  None today.

## 2013-12-14 NOTE — Progress Notes (Signed)
p-97 

## 2013-12-15 LAB — GLUCOSE TOLERANCE, 1 HOUR (50G) W/O FASTING: Glucose, 1 Hour GTT: 75 mg/dL (ref 70–140)

## 2013-12-17 ENCOUNTER — Telehealth: Payer: Self-pay | Admitting: *Deleted

## 2013-12-17 NOTE — Telephone Encounter (Signed)
Pt notified of normal 1 hr GTT. 

## 2013-12-19 ENCOUNTER — Ambulatory Visit (INDEPENDENT_AMBULATORY_CARE_PROVIDER_SITE_OTHER): Payer: PRIVATE HEALTH INSURANCE | Admitting: Obstetrics & Gynecology

## 2013-12-19 VITALS — BP 131/91 | Wt 166.0 lb

## 2013-12-19 DIAGNOSIS — N898 Other specified noninflammatory disorders of vagina: Secondary | ICD-10-CM

## 2013-12-19 DIAGNOSIS — Z23 Encounter for immunization: Secondary | ICD-10-CM

## 2013-12-19 DIAGNOSIS — O26859 Spotting complicating pregnancy, unspecified trimester: Secondary | ICD-10-CM

## 2013-12-19 DIAGNOSIS — Z3492 Encounter for supervision of normal pregnancy, unspecified, second trimester: Secondary | ICD-10-CM

## 2013-12-19 DIAGNOSIS — O9989 Other specified diseases and conditions complicating pregnancy, childbirth and the puerperium: Secondary | ICD-10-CM

## 2013-12-19 DIAGNOSIS — O26899 Other specified pregnancy related conditions, unspecified trimester: Secondary | ICD-10-CM

## 2013-12-19 DIAGNOSIS — Z3480 Encounter for supervision of other normal pregnancy, unspecified trimester: Secondary | ICD-10-CM

## 2013-12-19 MED ORDER — METRONIDAZOLE 500 MG PO TABS: 500.0000 mg | ORAL_TABLET | Freq: Two times a day (BID) | ORAL | Status: DC

## 2013-12-19 MED ORDER — TETANUS-DIPHTH-ACELL PERTUSSIS 5-2.5-18.5 LF-MCG/0.5 IM SUSP
0.5000 mL | Freq: Once | INTRAMUSCULAR | Status: AC
  Administered 2013-12-19: 0.5 mL via INTRAMUSCULAR

## 2013-12-19 NOTE — Progress Notes (Signed)
p-98  Light spotting last night and little more this am.   Urine dip clear no blood noted.

## 2013-12-19 NOTE — Progress Notes (Signed)
Work in for the complaint of pinkish vaginal discharge. Last sex was 2 weeks ago. She does have occasional bleeding gums and nose with blowing. Denies ROM or CTXs.  With speculum exam a large amount of frothy white discharge was noted as was a friable cervix. Closed tight. Wet prep sent. Flagyl prescribed for presumptive BV. TDAP given NST reviewed and reassuring. No contractions. She declines to have a follow up u/s for anatomy.

## 2013-12-20 LAB — WET PREP, GENITAL
Clue Cells Wet Prep HPF POC: NONE SEEN
Trich, Wet Prep: NONE SEEN
Yeast Wet Prep HPF POC: NONE SEEN

## 2013-12-28 ENCOUNTER — Ambulatory Visit (INDEPENDENT_AMBULATORY_CARE_PROVIDER_SITE_OTHER): Payer: PRIVATE HEALTH INSURANCE | Admitting: Family

## 2013-12-28 VITALS — BP 137/81 | HR 93 | Wt 171.0 lb

## 2013-12-28 DIAGNOSIS — Z3492 Encounter for supervision of normal pregnancy, unspecified, second trimester: Secondary | ICD-10-CM

## 2013-12-28 DIAGNOSIS — Z3480 Encounter for supervision of other normal pregnancy, unspecified trimester: Secondary | ICD-10-CM

## 2013-12-28 NOTE — Progress Notes (Signed)
Doing well; reviewed 1 hr results.

## 2014-01-11 ENCOUNTER — Ambulatory Visit (INDEPENDENT_AMBULATORY_CARE_PROVIDER_SITE_OTHER): Payer: PRIVATE HEALTH INSURANCE | Admitting: Family

## 2014-01-11 ENCOUNTER — Encounter: Payer: Self-pay | Admitting: Family

## 2014-01-11 VITALS — BP 127/75 | HR 100 | Wt 172.0 lb

## 2014-01-11 DIAGNOSIS — Z3480 Encounter for supervision of other normal pregnancy, unspecified trimester: Secondary | ICD-10-CM

## 2014-01-11 DIAGNOSIS — Z3492 Encounter for supervision of normal pregnancy, unspecified, second trimester: Secondary | ICD-10-CM

## 2014-01-11 NOTE — Progress Notes (Signed)
Doing well, considering doing waterbirth, will sign up for class to learn more.

## 2014-01-25 ENCOUNTER — Encounter: Payer: Self-pay | Admitting: Family

## 2014-01-25 ENCOUNTER — Ambulatory Visit (INDEPENDENT_AMBULATORY_CARE_PROVIDER_SITE_OTHER): Payer: Self-pay | Admitting: Family

## 2014-01-25 VITALS — BP 111/78 | HR 110 | Wt 176.0 lb

## 2014-01-25 DIAGNOSIS — Z3480 Encounter for supervision of other normal pregnancy, unspecified trimester: Secondary | ICD-10-CM

## 2014-01-25 DIAGNOSIS — Z3492 Encounter for supervision of normal pregnancy, unspecified, second trimester: Secondary | ICD-10-CM

## 2014-01-25 NOTE — Progress Notes (Signed)
Currently had 3D/4D ultrasound; no questions or concerns.  Discussed contraception, likely will use Mirena.

## 2014-02-08 ENCOUNTER — Ambulatory Visit (INDEPENDENT_AMBULATORY_CARE_PROVIDER_SITE_OTHER): Payer: PRIVATE HEALTH INSURANCE | Admitting: Advanced Practice Midwife

## 2014-02-08 VITALS — BP 112/73 | HR 114 | Wt 178.0 lb

## 2014-02-08 DIAGNOSIS — Z3400 Encounter for supervision of normal first pregnancy, unspecified trimester: Secondary | ICD-10-CM

## 2014-02-08 LAB — OB RESULTS CONSOLE GC/CHLAMYDIA
CHLAMYDIA, DNA PROBE: NEGATIVE
GC PROBE AMP, GENITAL: NEGATIVE

## 2014-02-08 LAB — OB RESULTS CONSOLE GBS: GBS: POSITIVE

## 2014-02-08 NOTE — Progress Notes (Signed)
Cultures done. Doing well.

## 2014-02-08 NOTE — Patient Instructions (Signed)
Third Trimester of Pregnancy  The third trimester is from week 29 through week 42, months 7 through 9. The third trimester is a time when the fetus is growing rapidly. At the end of the ninth month, the fetus is about 20 inches in length and weighs 6 10 pounds.   BODY CHANGES  Your body goes through many changes during pregnancy. The changes vary from woman to woman.    Your weight will continue to increase. You can expect to gain 25 35 pounds (11 16 kg) by the end of the pregnancy.   You may begin to get stretch marks on your hips, abdomen, and breasts.   You may urinate more often because the fetus is moving lower into your pelvis and pressing on your bladder.   You may develop or continue to have heartburn as a result of your pregnancy.   You may develop constipation because certain hormones are causing the muscles that push waste through your intestines to slow down.   You may develop hemorrhoids or swollen, bulging veins (varicose veins).   You may have pelvic pain because of the weight gain and pregnancy hormones relaxing your joints between the bones in your pelvis. Back aches may result from over exertion of the muscles supporting your posture.   Your breasts will continue to grow and be tender. A yellow discharge may leak from your breasts called colostrum.   Your belly button may stick out.   You may feel short of breath because of your expanding uterus.   You may notice the fetus "dropping," or moving lower in your abdomen.   You may have a bloody mucus discharge. This usually occurs a few days to a week before labor begins.   Your cervix becomes thin and soft (effaced) near your due date.  WHAT TO EXPECT AT YOUR PRENATAL EXAMS   You will have prenatal exams every 2 weeks until week 36. Then, you will have weekly prenatal exams. During a routine prenatal visit:   You will be weighed to make sure you and the fetus are growing normally.   Your blood pressure is taken.   Your abdomen will be  measured to track your baby's growth.   The fetal heartbeat will be listened to.   Any test results from the previous visit will be discussed.   You may have a cervical check near your due date to see if you have effaced.  At around 36 weeks, your caregiver will check your cervix. At the same time, your caregiver will also perform a test on the secretions of the vaginal tissue. This test is to determine if a type of bacteria, Group B streptococcus, is present. Your caregiver will explain this further.  Your caregiver may ask you:   What your birth plan is.   How you are feeling.   If you are feeling the baby move.   If you have had any abnormal symptoms, such as leaking fluid, bleeding, severe headaches, or abdominal cramping.   If you have any questions.  Other tests or screenings that may be performed during your third trimester include:   Blood tests that check for low iron levels (anemia).   Fetal testing to check the health, activity level, and growth of the fetus. Testing is done if you have certain medical conditions or if there are problems during the pregnancy.  FALSE LABOR  You may feel small, irregular contractions that eventually go away. These are called Braxton Hicks contractions, or   false labor. Contractions may last for hours, days, or even weeks before true labor sets in. If contractions come at regular intervals, intensify, or become painful, it is best to be seen by your caregiver.   SIGNS OF LABOR    Menstrual-like cramps.   Contractions that are 5 minutes apart or less.   Contractions that start on the top of the uterus and spread down to the lower abdomen and back.   A sense of increased pelvic pressure or back pain.   A watery or bloody mucus discharge that comes from the vagina.  If you have any of these signs before the 37th week of pregnancy, call your caregiver right away. You need to go to the hospital to get checked immediately.  HOME CARE INSTRUCTIONS    Avoid all  smoking, herbs, alcohol, and unprescribed drugs. These chemicals affect the formation and growth of the baby.   Follow your caregiver's instructions regarding medicine use. There are medicines that are either safe or unsafe to take during pregnancy.   Exercise only as directed by your caregiver. Experiencing uterine cramps is a good sign to stop exercising.   Continue to eat regular, healthy meals.   Wear a good support bra for breast tenderness.   Do not use hot tubs, steam rooms, or saunas.   Wear your seat belt at all times when driving.   Avoid raw meat, uncooked cheese, cat litter boxes, and soil used by cats. These carry germs that can cause birth defects in the baby.   Take your prenatal vitamins.   Try taking a stool softener (if your caregiver approves) if you develop constipation. Eat more high-fiber foods, such as fresh vegetables or fruit and whole grains. Drink plenty of fluids to keep your urine clear or pale yellow.   Take warm sitz baths to soothe any pain or discomfort caused by hemorrhoids. Use hemorrhoid cream if your caregiver approves.   If you develop varicose veins, wear support hose. Elevate your feet for 15 minutes, 3 4 times a day. Limit salt in your diet.   Avoid heavy lifting, wear low heal shoes, and practice good posture.   Rest a lot with your legs elevated if you have leg cramps or low back pain.   Visit your dentist if you have not gone during your pregnancy. Use a soft toothbrush to brush your teeth and be gentle when you floss.   A sexual relationship may be continued unless your caregiver directs you otherwise.   Do not travel far distances unless it is absolutely necessary and only with the approval of your caregiver.   Take prenatal classes to understand, practice, and ask questions about the labor and delivery.   Make a trial run to the hospital.   Pack your hospital bag.   Prepare the baby's nursery.   Continue to go to all your prenatal visits as directed  by your caregiver.  SEEK MEDICAL CARE IF:   You are unsure if you are in labor or if your water has broken.   You have dizziness.   You have mild pelvic cramps, pelvic pressure, or nagging pain in your abdominal area.   You have persistent nausea, vomiting, or diarrhea.   You have a bad smelling vaginal discharge.   You have pain with urination.  SEEK IMMEDIATE MEDICAL CARE IF:    You have a fever.   You are leaking fluid from your vagina.   You have spotting or bleeding from your vagina.     You have severe abdominal cramping or pain.   You have rapid weight loss or gain.   You have shortness of breath with chest pain.   You notice sudden or extreme swelling of your face, hands, ankles, feet, or legs.   You have not felt your baby move in over an hour.   You have severe headaches that do not go away with medicine.   You have vision changes.  Document Released: 08/17/2001 Document Revised: 04/25/2013 Document Reviewed: 10/24/2012  [employer] Patient Information 2014 [employer]

## 2014-02-09 LAB — GC/CHLAMYDIA PROBE AMP
CT Probe RNA: NEGATIVE
GC PROBE AMP APTIMA: NEGATIVE

## 2014-02-10 LAB — CULTURE, BETA STREP (GROUP B ONLY)

## 2014-02-12 ENCOUNTER — Ambulatory Visit (INDEPENDENT_AMBULATORY_CARE_PROVIDER_SITE_OTHER): Payer: PRIVATE HEALTH INSURANCE | Admitting: Obstetrics & Gynecology

## 2014-02-12 ENCOUNTER — Ambulatory Visit (HOSPITAL_COMMUNITY)
Admission: RE | Admit: 2014-02-12 | Discharge: 2014-02-12 | Disposition: A | Discharge status: Home / Self Care | Payer: PRIVATE HEALTH INSURANCE | Source: Ambulatory Visit | Attending: Obstetrics & Gynecology | Admitting: Obstetrics & Gynecology

## 2014-02-12 VITALS — BP 120/82 | HR 88 | Temp 98.2°F | Wt 178.0 lb

## 2014-02-12 DIAGNOSIS — O479 False labor, unspecified: Secondary | ICD-10-CM

## 2014-02-12 DIAGNOSIS — Z3480 Encounter for supervision of other normal pregnancy, unspecified trimester: Secondary | ICD-10-CM

## 2014-02-12 DIAGNOSIS — Z3689 Encounter for other specified antenatal screening: Secondary | ICD-10-CM | POA: Insufficient documentation

## 2014-02-12 DIAGNOSIS — O368190 Decreased fetal movements, unspecified trimester, not applicable or unspecified: Secondary | ICD-10-CM | POA: Insufficient documentation

## 2014-02-12 NOTE — Progress Notes (Signed)
Pt here today with c/o diarrhea, stomach pain, "stomach burning", and nausea. Sx started 2 days ago.

## 2014-02-12 NOTE — Progress Notes (Signed)
NST is reactive but ? Decelerations to 130.  NOt long enough for change in baseline.  Having some contractions.  Variability is good.  Will get BPP to be safe.  Cervix is closed.  NO evidence of labor.  Abdominal pain and diarrhea is most likely viral.  Keep hydrated.

## 2014-02-15 ENCOUNTER — Encounter (HOSPITAL_COMMUNITY): Payer: Self-pay | Admitting: Advanced Practice Midwife

## 2014-02-18 ENCOUNTER — Ambulatory Visit (INDEPENDENT_AMBULATORY_CARE_PROVIDER_SITE_OTHER): Payer: PRIVATE HEALTH INSURANCE | Admitting: Advanced Practice Midwife

## 2014-02-18 ENCOUNTER — Encounter: Payer: Self-pay | Admitting: Advanced Practice Midwife

## 2014-02-18 VITALS — BP 128/88 | HR 99 | Wt 178.0 lb

## 2014-02-18 DIAGNOSIS — Z3480 Encounter for supervision of other normal pregnancy, unspecified trimester: Secondary | ICD-10-CM

## 2014-02-18 DIAGNOSIS — Z3492 Encounter for supervision of normal pregnancy, unspecified, second trimester: Secondary | ICD-10-CM

## 2014-02-18 NOTE — Progress Notes (Signed)
Pos GBS discussed. BPP 8/8 on 6/9. Still having loose stools once a day, 3 BMs per day. Recommend binding foods. Low suspicion food-borne illness. Precautions reviewed.

## 2014-02-18 NOTE — Patient Instructions (Signed)
Braxton Hicks Contractions Pregnancy is commonly associated with contractions of the uterus throughout the pregnancy. Towards the end of pregnancy (32 to 34 weeks), these contractions (Braxton Hicks) can develop more often and may become more forceful. This is not true labor because these contractions do not result in opening (dilatation) and thinning of the cervix. They are sometimes difficult to tell apart from true labor because these contractions can be forceful and people have different pain tolerances. You should not feel embarrassed if you go to the hospital with false labor. Sometimes, the only way to tell if you are in true labor is for your caregiver to follow the changes in the cervix. How to tell the difference between true and false labor:  False labor.  The contractions of false labor are usually shorter, irregular and not as hard as those of true labor.  They are often felt in the front of the lower abdomen and in the groin.  They may leave with walking around or changing positions while lying down.  They get weaker and are shorter lasting as time goes on.  These contractions are usually irregular.  They do not usually become progressively stronger, regular and closer together as with true labor.  True labor.  Contractions in true labor last 30 to 70 seconds, become very regular, usually become more intense, and increase in frequency.  They do not go away with walking.  The discomfort is usually felt in the top of the uterus and spreads to the lower abdomen and low back.  True labor can be determined by your caregiver with an exam. This will show that the cervix is dilating and getting thinner. If there are no prenatal problems or other health problems associated with the pregnancy, it is completely safe to be sent home with false labor and await the onset of true labor. HOME CARE INSTRUCTIONS   Keep up with your usual exercises and instructions.  Take medications as  directed.  Keep your regular prenatal appointment.  Eat and drink lightly if you think you are going into labor.  If BH contractions are making you uncomfortable:  Change your activity position from lying down or resting to walking/walking to resting.  Sit and rest in a tub of warm water.  Drink 2 to 3 glasses of water. Dehydration may cause B-H contractions.  Do slow and deep breathing several times an hour. SEEK IMMEDIATE MEDICAL CARE IF:   Your contractions continue to become stronger, more regular, and closer together.  You have a gushing, burst or leaking of fluid from the vagina.  An oral temperature above 102 F (38.9 C) develops.  You have passage of blood-tinged mucus.  You develop vaginal bleeding.  You develop continuous belly (abdominal) pain.  You have low back pain that you never had before.  You feel the baby's head pushing down causing pelvic pressure.  The baby is not moving as much as it used to. Document Released: 08/23/2005 Document Revised: 11/15/2011 Document Reviewed: 06/04/2013 [employer] Patient Information 2014 [employer].  Fetal Movement Counts Patient Name: __________________________________________________ Patient Due Date: ____________________ Performing a fetal movement count is highly recommended in high-risk pregnancies, but it is good for every pregnant woman to do. Your caregiver may ask you to start counting fetal movements at 28 weeks of the pregnancy. Fetal movements often increase:  After eating a full meal.  After physical activity.  After eating or drinking something sweet or cold.  At rest. Pay attention to when you feel   the baby is most active. This will help you notice a pattern of your baby's sleep and wake cycles and what factors contribute to an increase in fetal movement. It is important to perform a fetal movement count at the same time each day when your baby is normally most active.  HOW TO COUNT FETAL  MOVEMENTS 1. Find a quiet and comfortable area to sit or lie down on your left side. Lying on your left side provides the best blood and oxygen circulation to your baby. 2. Write down the day and time on a sheet of paper or in a journal. 3. Start counting kicks, flutters, swishes, rolls, or jabs in a 2 hour period. You should feel at least 10 movements within 2 hours. 4. If you do not feel 10 movements in 2 hours, wait 2 3 hours and count again. Look for a change in the pattern or not enough counts in 2 hours. SEEK MEDICAL CARE IF:  You feel less than 10 counts in 2 hours, tried twice.  There is no movement in over an hour.  The pattern is changing or taking longer each day to reach 10 counts in 2 hours.  You feel the baby is not moving as he or she usually does. Date: ____________ Movements: ____________ Start time: ____________ Finish time: ____________  Date: ____________ Movements: ____________ Start time: ____________ Finish time: ____________ Date: ____________ Movements: ____________ Start time: ____________ Finish time: ____________ Date: ____________ Movements: ____________ Start time: ____________ Finish time: ____________ Date: ____________ Movements: ____________ Start time: ____________ Finish time: ____________ Date: ____________ Movements: ____________ Start time: ____________ Finish time: ____________ Date: ____________ Movements: ____________ Start time: ____________ Finish time: ____________ Date: ____________ Movements: ____________ Start time: ____________ Finish time: ____________  Date: ____________ Movements: ____________ Start time: ____________ Finish time: ____________ Date: ____________ Movements: ____________ Start time: ____________ Finish time: ____________ Date: ____________ Movements: ____________ Start time: ____________ Finish time: ____________ Date: ____________ Movements: ____________ Start time: ____________ Finish time: ____________ Date: ____________  Movements: ____________ Start time: ____________ Finish time: ____________ Date: ____________ Movements: ____________ Start time: ____________ Finish time: ____________ Date: ____________ Movements: ____________ Start time: ____________ Finish time: ____________  Date: ____________ Movements: ____________ Start time: ____________ Finish time: ____________ Date: ____________ Movements: ____________ Start time: ____________ Finish time: ____________ Date: ____________ Movements: ____________ Start time: ____________ Finish time: ____________ Date: ____________ Movements: ____________ Start time: ____________ Finish time: ____________ Date: ____________ Movements: ____________ Start time: ____________ Finish time: ____________ Date: ____________ Movements: ____________ Start time: ____________ Finish time: ____________ Date: ____________ Movements: ____________ Start time: ____________ Finish time: ____________  Date: ____________ Movements: ____________ Start time: ____________ Finish time: ____________ Date: ____________ Movements: ____________ Start time: ____________ Finish time: ____________ Date: ____________ Movements: ____________ Start time: ____________ Finish time: ____________ Date: ____________ Movements: ____________ Start time: ____________ Finish time: ____________ Date: ____________ Movements: ____________ Start time: ____________ Finish time: ____________ Date: ____________ Movements: ____________ Start time: ____________ Finish time: ____________ Date: ____________ Movements: ____________ Start time: ____________ Finish time: ____________  Date: ____________ Movements: ____________ Start time: ____________ Finish time: ____________ Date: ____________ Movements: ____________ Start time: ____________ Finish time: ____________ Date: ____________ Movements: ____________ Start time: ____________ Finish time: ____________ Date: ____________ Movements: ____________ Start time:  ____________ Finish time: ____________ Date: ____________ Movements: ____________ Start time: ____________ Finish time: ____________ Date: ____________ Movements: ____________ Start time: ____________ Finish time: ____________ Date: ____________ Movements: ____________ Start time: ____________ Finish time: ____________  Date: ____________ Movements: ____________ Start time: ____________ Finish time: ____________ Date: ____________ Movements: ____________ Start   time: ____________ Finish time: ____________ Date: ____________ Movements: ____________ Start time: ____________ Finish time: ____________ Date: ____________ Movements: ____________ Start time: ____________ Finish time: ____________ Date: ____________ Movements: ____________ Start time: ____________ Finish time: ____________ Date: ____________ Movements: ____________ Start time: ____________ Finish time: ____________ Date: ____________ Movements: ____________ Start time: ____________ Finish time: ____________  Date: ____________ Movements: ____________ Start time: ____________ Finish time: ____________ Date: ____________ Movements: ____________ Start time: ____________ Finish time: ____________ Date: ____________ Movements: ____________ Start time: ____________ Finish time: ____________ Date: ____________ Movements: ____________ Start time: ____________ Finish time: ____________ Date: ____________ Movements: ____________ Start time: ____________ Finish time: ____________ Date: ____________ Movements: ____________ Start time: ____________ Finish time: ____________ Date: ____________ Movements: ____________ Start time: ____________ Finish time: ____________  Date: ____________ Movements: ____________ Start time: ____________ Finish time: ____________ Date: ____________ Movements: ____________ Start time: ____________ Finish time: ____________ Date: ____________ Movements: ____________ Start time: ____________ Finish time: ____________ Date:  ____________ Movements: ____________ Start time: ____________ Finish time: ____________ Date: ____________ Movements: ____________ Start time: ____________ Finish time: ____________ Date: ____________ Movements: ____________ Start time: ____________ Finish time: ____________ Document Released: 09/22/2006 Document Revised: 08/09/2012 Document Reviewed: 06/19/2012 [employer] Patient Information 2014 [employer].  

## 2014-02-18 NOTE — Progress Notes (Signed)
Still having some loose stools

## 2014-02-25 ENCOUNTER — Ambulatory Visit (INDEPENDENT_AMBULATORY_CARE_PROVIDER_SITE_OTHER): Payer: PRIVATE HEALTH INSURANCE | Admitting: Advanced Practice Midwife

## 2014-02-25 VITALS — BP 119/78 | HR 102 | Wt 177.0 lb

## 2014-02-25 DIAGNOSIS — Z3480 Encounter for supervision of other normal pregnancy, unspecified trimester: Secondary | ICD-10-CM

## 2014-02-25 DIAGNOSIS — O368190 Decreased fetal movements, unspecified trimester, not applicable or unspecified: Secondary | ICD-10-CM

## 2014-02-25 DIAGNOSIS — O368131 Decreased fetal movements, third trimester, fetus 1: Secondary | ICD-10-CM

## 2014-02-25 DIAGNOSIS — Z3492 Encounter for supervision of normal pregnancy, unspecified, second trimester: Secondary | ICD-10-CM

## 2014-02-25 NOTE — Progress Notes (Signed)
Decreased FM. NST reactive.

## 2014-02-25 NOTE — Patient Instructions (Signed)
Braxton Hicks Contractions °Contractions of the uterus can occur throughout pregnancy. Contractions are not always a sign that you are in labor.  °WHAT ARE BRAXTON HICKS CONTRACTIONS?  °Contractions that occur before labor are called Braxton Hicks contractions, or false labor. Toward the end of pregnancy (32-34 weeks), these contractions can develop more often and may become more forceful. This is not true labor because these contractions do not result in opening (dilatation) and thinning of the cervix. They are sometimes difficult to tell apart from true labor because these contractions can be forceful and people have different pain tolerances. You should not feel embarrassed if you go to the hospital with false labor. Sometimes, the only way to tell if you are in true labor is for your health care provider to look for changes in the cervix. °If there are no prenatal problems or other health problems associated with the pregnancy, it is completely safe to be sent home with false labor and await the onset of true labor. °HOW CAN YOU TELL THE DIFFERENCE BETWEEN TRUE AND FALSE LABOR? °False Labor °· The contractions of false labor are usually shorter and not as hard as those of true labor.   °· The contractions are usually irregular.   °· The contractions are often felt in the front of the lower abdomen and in the groin.   °· The contractions may go away when you walk around or change positions while lying down.   °· The contractions get weaker and are shorter lasting as time goes on.   °· The contractions do not usually become progressively stronger, regular, and closer together as with true labor.   °True Labor °· Contractions in true labor last 30-70 seconds, become very regular, usually become more intense, and increase in frequency.   °· The contractions do not go away with walking.   °· The discomfort is usually felt in the top of the uterus and spreads to the lower abdomen and low back.   °· True labor can be  determined by your health care provider with an exam. This will show that the cervix is dilating and getting thinner.   °WHAT TO REMEMBER °· Keep up with your usual exercises and follow other instructions given by your health care provider.   °· Take medicines as directed by your health care provider.   °· Keep your regular prenatal appointments.   °· Eat and drink lightly if you think you are going into labor.   °· If Braxton Hicks contractions are making you uncomfortable:   °¨ Change your position from lying down or resting to walking, or from walking to resting.   °¨ Sit and rest in a tub of warm water.   °¨ Drink 2-3 glasses of water. Dehydration may cause these contractions.   °¨ Do slow and deep breathing several times an hour.   °WHEN SHOULD I SEEK IMMEDIATE MEDICAL CARE? °Seek immediate medical care if: °· Your contractions become stronger, more regular, and closer together.   °· You have fluid leaking or gushing from your vagina.   °· You have a fever.   °· You pass blood-tinged mucus.   °· You have vaginal bleeding.   °· You have continuous abdominal pain.   °· You have low back pain that you never had before.   °· You feel your baby's head pushing down and causing pelvic pressure.   °· Your baby is not moving as much as it used to.   °Document Released: 08/23/2005 Document Revised: 08/28/2013 Document Reviewed: 06/04/2013 °ExitCare® Patient Information ©2015 [employer]. This information is not intended to replace advice given to you by your health care   provider. Make sure you discuss any questions you have with your health care provider. ° °Fetal Movement Counts °Patient Name: __________________________________________________ Patient Due Date: ____________________ °Performing a fetal movement count is highly recommended in high-risk pregnancies, but it is good for every pregnant woman to do. Your caregiver may ask you to start counting fetal movements at 28 weeks of the pregnancy. Fetal movements  often increase: °· After eating a full meal. °· After physical activity. °· After eating or drinking something sweet or cold. °· At rest. °Pay attention to when you feel the baby is most active. This will help you notice a pattern of your baby's sleep and wake cycles and what factors contribute to an increase in fetal movement. It is important to perform a fetal movement count at the same time each day when your baby is normally most active.  °HOW TO COUNT FETAL MOVEMENTS °1. Find a quiet and comfortable area to sit or lie down on your left side. Lying on your left side provides the best blood and oxygen circulation to your baby. °2. Write down the day and time on a sheet of paper or in a journal. °3. Start counting kicks, flutters, swishes, rolls, or jabs in a 2 hour period. You should feel at least 10 movements within 2 hours. °4. If you do not feel 10 movements in 2 hours, wait 2-3 hours and count again. Look for a change in the pattern or not enough counts in 2 hours. °SEEK MEDICAL CARE IF: °· You feel less than 10 counts in 2 hours, tried twice. °· There is no movement in over an hour. °· The pattern is changing or taking longer each day to reach 10 counts in 2 hours. °· You feel the baby is not moving as he or she usually does. °Date: ____________ Movements: ____________ Start time: ____________ Finish time: ____________  °Date: ____________ Movements: ____________ Start time: ____________ Finish time: ____________ °Date: ____________ Movements: ____________ Start time: ____________ Finish time: ____________ °Date: ____________ Movements: ____________ Start time: ____________ Finish time: ____________ °Date: ____________ Movements: ____________ Start time: ____________ Finish time: ____________ °Date: ____________ Movements: ____________ Start time: ____________ Finish time: ____________ °Date: ____________ Movements: ____________ Start time: ____________ Finish time: ____________ °Date: ____________  Movements: ____________ Start time: ____________ Finish time: ____________  °Date: ____________ Movements: ____________ Start time: ____________ Finish time: ____________ °Date: ____________ Movements: ____________ Start time: ____________ Finish time: ____________ °Date: ____________ Movements: ____________ Start time: ____________ Finish time: ____________ °Date: ____________ Movements: ____________ Start time: ____________ Finish time: ____________ °Date: ____________ Movements: ____________ Start time: ____________ Finish time: ____________ °Date: ____________ Movements: ____________ Start time: ____________ Finish time: ____________ °Date: ____________ Movements: ____________ Start time: ____________ Finish time: ____________  °Date: ____________ Movements: ____________ Start time: ____________ Finish time: ____________ °Date: ____________ Movements: ____________ Start time: ____________ Finish time: ____________ °Date: ____________ Movements: ____________ Start time: ____________ Finish time: ____________ °Date: ____________ Movements: ____________ Start time: ____________ Finish time: ____________ °Date: ____________ Movements: ____________ Start time: ____________ Finish time: ____________ °Date: ____________ Movements: ____________ Start time: ____________ Finish time: ____________ °Date: ____________ Movements: ____________ Start time: ____________ Finish time: ____________  °Date: ____________ Movements: ____________ Start time: ____________ Finish time: ____________ °Date: ____________ Movements: ____________ Start time: ____________ Finish time: ____________ °Date: ____________ Movements: ____________ Start time: ____________ Finish time: ____________ °Date: ____________ Movements: ____________ Start time: ____________ Finish time: ____________ °Date: ____________ Movements: ____________ Start time: ____________ Finish time: ____________ °Date: ____________ Movements: ____________ Start time:  ____________ Finish time: ____________ °Date: ____________ Movements: ____________   Start time: ____________ Finish time: ____________  °Date: ____________ Movements: ____________ Start time: ____________ Finish time: ____________ °Date: ____________ Movements: ____________ Start time: ____________ Finish time: ____________ °Date: ____________ Movements: ____________ Start time: ____________ Finish time: ____________ °Date: ____________ Movements: ____________ Start time: ____________ Finish time: ____________ °Date: ____________ Movements: ____________ Start time: ____________ Finish time: ____________ °Date: ____________ Movements: ____________ Start time: ____________ Finish time: ____________ °Date: ____________ Movements: ____________ Start time: ____________ Finish time: ____________  °Date: ____________ Movements: ____________ Start time: ____________ Finish time: ____________ °Date: ____________ Movements: ____________ Start time: ____________ Finish time: ____________ °Date: ____________ Movements: ____________ Start time: ____________ Finish time: ____________ °Date: ____________ Movements: ____________ Start time: ____________ Finish time: ____________ °Date: ____________ Movements: ____________ Start time: ____________ Finish time: ____________ °Date: ____________ Movements: ____________ Start time: ____________ Finish time: ____________ °Date: ____________ Movements: ____________ Start time: ____________ Finish time: ____________  °Date: ____________ Movements: ____________ Start time: ____________ Finish time: ____________ °Date: ____________ Movements: ____________ Start time: ____________ Finish time: ____________ °Date: ____________ Movements: ____________ Start time: ____________ Finish time: ____________ °Date: ____________ Movements: ____________ Start time: ____________ Finish time: ____________ °Date: ____________ Movements: ____________ Start time: ____________ Finish time: ____________ °Date:  ____________ Movements: ____________ Start time: ____________ Finish time: ____________ °Date: ____________ Movements: ____________ Start time: ____________ Finish time: ____________  °Date: ____________ Movements: ____________ Start time: ____________ Finish time: ____________ °Date: ____________ Movements: ____________ Start time: ____________ Finish time: ____________ °Date: ____________ Movements: ____________ Start time: ____________ Finish time: ____________ °Date: ____________ Movements: ____________ Start time: ____________ Finish time: ____________ °Date: ____________ Movements: ____________ Start time: ____________ Finish time: ____________ °Date: ____________ Movements: ____________ Start time: ____________ Finish time: ____________ °Document Released: 09/22/2006 Document Revised: 08/09/2012 Document Reviewed: 06/19/2012 °ExitCare® Patient Information ©2015 [employer]. This information is not intended to replace advice given to you by your health care provider. Make sure you discuss any questions you have with your health care provider. ° °

## 2014-02-27 LAB — OB RESULTS CONSOLE GBS: GBS: POSITIVE

## 2014-03-04 ENCOUNTER — Ambulatory Visit (INDEPENDENT_AMBULATORY_CARE_PROVIDER_SITE_OTHER): Payer: PRIVATE HEALTH INSURANCE | Admitting: Obstetrics & Gynecology

## 2014-03-04 VITALS — BP 118/87 | HR 101 | Wt 177.0 lb

## 2014-03-04 DIAGNOSIS — Z3480 Encounter for supervision of other normal pregnancy, unspecified trimester: Secondary | ICD-10-CM

## 2014-03-04 DIAGNOSIS — Z3492 Encounter for supervision of normal pregnancy, unspecified, second trimester: Secondary | ICD-10-CM

## 2014-03-04 NOTE — Progress Notes (Signed)
Improved FM. No other complaints or concerns.  Fetal movement and labor precautions reviewed.  Postdates testing next visit.

## 2014-03-04 NOTE — Patient Instructions (Signed)
Return to clinic for any obstetric concerns or go to MAU for evaluation  

## 2014-03-09 ENCOUNTER — Inpatient Hospital Stay (HOSPITAL_COMMUNITY)
Admission: AD | Admit: 2014-03-09 | Discharge: 2014-03-11 | DRG: 775 | Disposition: A | Discharge status: Home / Self Care | Payer: BC Managed Care – PPO | Source: Ambulatory Visit | Attending: Obstetrics & Gynecology | Admitting: Obstetrics & Gynecology

## 2014-03-09 ENCOUNTER — Encounter (HOSPITAL_COMMUNITY): Payer: Self-pay | Admitting: *Deleted

## 2014-03-09 DIAGNOSIS — O99892 Other specified diseases and conditions complicating childbirth: Secondary | ICD-10-CM | POA: Diagnosis present

## 2014-03-09 DIAGNOSIS — O9989 Other specified diseases and conditions complicating pregnancy, childbirth and the puerperium: Secondary | ICD-10-CM

## 2014-03-09 DIAGNOSIS — IMO0001 Reserved for inherently not codable concepts without codable children: Secondary | ICD-10-CM

## 2014-03-09 DIAGNOSIS — Z87891 Personal history of nicotine dependence: Secondary | ICD-10-CM

## 2014-03-09 DIAGNOSIS — Z22330 Carrier of Group B streptococcus: Secondary | ICD-10-CM

## 2014-03-09 LAB — URINALYSIS, ROUTINE W REFLEX MICROSCOPIC
BILIRUBIN URINE: NEGATIVE
Glucose, UA: NEGATIVE mg/dL
Hgb urine dipstick: NEGATIVE
KETONES UR: 15 mg/dL — AB
Leukocytes, UA: NEGATIVE
NITRITE: NEGATIVE
PH: 6.5 (ref 5.0–8.0)
PROTEIN: NEGATIVE mg/dL
Specific Gravity, Urine: <1.005 — ABNORMAL LOW (ref 1.005–1.030)
UROBILINOGEN UA: 0.2 mg/dL (ref 0.0–1.0)

## 2014-03-09 LAB — RPR

## 2014-03-09 LAB — TYPE AND SCREEN
ABO/RH(D): A POS
ANTIBODY SCREEN: NEGATIVE

## 2014-03-09 LAB — CBC
HCT: 36 % (ref 36.0–46.0)
Hemoglobin: 13 g/dL (ref 12.0–15.0)
MCH: 32.7 pg (ref 26.0–34.0)
MCHC: 36.1 g/dL — ABNORMAL HIGH (ref 30.0–36.0)
MCV: 90.7 fL (ref 78.0–100.0)
Platelets: 214 10*3/uL (ref 150–400)
RBC: 3.97 MIL/uL (ref 3.87–5.11)
RDW: 12.3 % (ref 11.5–15.5)
WBC: 13.2 10*3/uL — AB (ref 4.0–10.5)

## 2014-03-09 LAB — ABO/RH: ABO/RH(D): A POS

## 2014-03-09 MED ORDER — LACTATED RINGERS IV SOLN: 500.0000 mL | INTRAVENOUS | Status: DC | PRN

## 2014-03-09 MED ORDER — LANOLIN HYDROUS EX OINT: TOPICAL_OINTMENT | CUTANEOUS | Status: DC | PRN

## 2014-03-09 MED ORDER — EPHEDRINE 5 MG/ML INJ
10.0000 mg | INTRAVENOUS | Status: DC | PRN
  Filled 2014-03-09: qty 2

## 2014-03-09 MED ORDER — OXYCODONE-ACETAMINOPHEN 5-325 MG PO TABS: 1.0000 | ORAL_TABLET | ORAL | Status: DC | PRN

## 2014-03-09 MED ORDER — DIPHENHYDRAMINE HCL 25 MG PO CAPS: 25.0000 mg | ORAL_CAPSULE | Freq: Four times a day (QID) | ORAL | Status: DC | PRN

## 2014-03-09 MED ORDER — HYDROXYZINE HCL 50 MG PO TABS
50.0000 mg | ORAL_TABLET | Freq: Four times a day (QID) | ORAL | Status: DC | PRN
  Filled 2014-03-09: qty 1

## 2014-03-09 MED ORDER — DIPHENHYDRAMINE HCL 50 MG/ML IJ SOLN: 12.5000 mg | INTRAMUSCULAR | Status: DC | PRN

## 2014-03-09 MED ORDER — OXYTOCIN 10 UNIT/ML IJ SOLN
INTRAMUSCULAR | Status: AC
  Administered 2014-03-09: 10 [IU] via INTRAMUSCULAR
  Filled 2014-03-09: qty 1

## 2014-03-09 MED ORDER — OXYTOCIN BOLUS FROM INFUSION: 500.0000 mL | INTRAVENOUS | Status: DC

## 2014-03-09 MED ORDER — OXYTOCIN 40 UNITS IN LACTATED RINGERS INFUSION - SIMPLE MED
62.5000 mL/h | INTRAVENOUS | Status: DC
  Filled 2014-03-09: qty 1000

## 2014-03-09 MED ORDER — SENNOSIDES-DOCUSATE SODIUM 8.6-50 MG PO TABS
2.0000 | ORAL_TABLET | ORAL | Status: DC
  Administered 2014-03-11: 2 via ORAL
  Filled 2014-03-09: qty 2

## 2014-03-09 MED ORDER — DEXTROSE 5 % IV SOLN
2.5000 10*6.[IU] | INTRAVENOUS | Status: DC
  Filled 2014-03-09 (×5): qty 2.5

## 2014-03-09 MED ORDER — LACTATED RINGERS IV SOLN: 500.0000 mL | Freq: Once | INTRAVENOUS | Status: DC

## 2014-03-09 MED ORDER — LIDOCAINE HCL (PF) 1 % IJ SOLN
30.0000 mL | INTRAMUSCULAR | Status: DC | PRN
  Administered 2014-03-09: 30 mL via SUBCUTANEOUS
  Filled 2014-03-09: qty 30

## 2014-03-09 MED ORDER — ONDANSETRON HCL 4 MG PO TABS: 4.0000 mg | ORAL_TABLET | ORAL | Status: DC | PRN

## 2014-03-09 MED ORDER — ZOLPIDEM TARTRATE 5 MG PO TABS: 5.0000 mg | ORAL_TABLET | Freq: Every evening | ORAL | Status: DC | PRN

## 2014-03-09 MED ORDER — FENTANYL 2.5 MCG/ML BUPIVACAINE 1/10 % EPIDURAL INFUSION (WH - ANES): 14.0000 mL/h | INTRAMUSCULAR | Status: DC | PRN

## 2014-03-09 MED ORDER — PENICILLIN G POTASSIUM 5000000 UNITS IJ SOLR
5.0000 10*6.[IU] | Freq: Once | INTRAVENOUS | Status: AC
  Administered 2014-03-09: 5 10*6.[IU] via INTRAVENOUS
  Filled 2014-03-09: qty 5

## 2014-03-09 MED ORDER — IBUPROFEN 600 MG PO TABS
600.0000 mg | ORAL_TABLET | Freq: Four times a day (QID) | ORAL | Status: DC
  Administered 2014-03-09 – 2014-03-11 (×8): 600 mg via ORAL
  Filled 2014-03-09 (×7): qty 1

## 2014-03-09 MED ORDER — DIBUCAINE 1 % RE OINT: 1.0000 "application " | TOPICAL_OINTMENT | RECTAL | Status: DC | PRN

## 2014-03-09 MED ORDER — ONDANSETRON HCL 4 MG/2ML IJ SOLN: 4.0000 mg | INTRAMUSCULAR | Status: DC | PRN

## 2014-03-09 MED ORDER — PHENYLEPHRINE 40 MCG/ML (10ML) SYRINGE FOR IV PUSH (FOR BLOOD PRESSURE SUPPORT)
80.0000 ug | PREFILLED_SYRINGE | INTRAVENOUS | Status: DC | PRN
  Filled 2014-03-09: qty 2

## 2014-03-09 MED ORDER — IBUPROFEN 600 MG PO TABS
600.0000 mg | ORAL_TABLET | Freq: Four times a day (QID) | ORAL | Status: DC | PRN
  Filled 2014-03-09: qty 1

## 2014-03-09 MED ORDER — TETANUS-DIPHTH-ACELL PERTUSSIS 5-2.5-18.5 LF-MCG/0.5 IM SUSP: 0.5000 mL | Freq: Once | INTRAMUSCULAR | Status: DC

## 2014-03-09 MED ORDER — ACETAMINOPHEN 325 MG PO TABS
650.0000 mg | ORAL_TABLET | ORAL | Status: DC | PRN
Start: 2014-03-09 — End: 2014-03-10

## 2014-03-09 MED ORDER — LACTATED RINGERS IV SOLN: INTRAVENOUS | Status: DC

## 2014-03-09 MED ORDER — SIMETHICONE 80 MG PO CHEW: 80.0000 mg | CHEWABLE_TABLET | ORAL | Status: DC | PRN

## 2014-03-09 MED ORDER — WITCH HAZEL-GLYCERIN EX PADS: 1.0000 "application " | MEDICATED_PAD | CUTANEOUS | Status: DC | PRN

## 2014-03-09 MED ORDER — PRENATAL MULTIVITAMIN CH
1.0000 | ORAL_TABLET | Freq: Every day | ORAL | Status: DC
  Administered 2014-03-10 – 2014-03-11 (×2): 1 via ORAL
  Filled 2014-03-09 (×2): qty 1

## 2014-03-09 MED ORDER — ONDANSETRON HCL 4 MG/2ML IJ SOLN
4.0000 mg | Freq: Four times a day (QID) | INTRAMUSCULAR | Status: DC | PRN
Start: 2014-03-09 — End: 2014-03-10
  Administered 2014-03-09: 4 mg via INTRAVENOUS
  Filled 2014-03-09: qty 2

## 2014-03-09 MED ORDER — CITRIC ACID-SODIUM CITRATE 334-500 MG/5ML PO SOLN: 30.0000 mL | ORAL | Status: DC | PRN

## 2014-03-09 MED ORDER — FENTANYL CITRATE 0.05 MG/ML IJ SOLN
100.0000 ug | INTRAMUSCULAR | Status: DC | PRN
  Administered 2014-03-09: 100 ug via INTRAVENOUS
  Filled 2014-03-09: qty 2

## 2014-03-09 MED ORDER — BENZOCAINE-MENTHOL 20-0.5 % EX AERO
1.0000 "application " | INHALATION_SPRAY | CUTANEOUS | Status: DC | PRN
  Administered 2014-03-11: 1 via TOPICAL
  Filled 2014-03-09 (×2): qty 56

## 2014-03-09 MED ORDER — FLEET ENEMA 7-19 GM/118ML RE ENEM: 1.0000 | ENEMA | RECTAL | Status: DC | PRN

## 2014-03-09 NOTE — H&P (Signed)
Meghan Delacruz is a 28 y.o. female G1P0 with IUP at 114w6d presenting for rule out labor secondary to increased contractions. Pt states she has been having regular, every 5-10 minutes contractions, associated with none vaginal bleeding.  Membranes are intact, with active fetal movement.   [facility]  Prenatal History/Complications: Meghan Delacruz is a 28 y.o. female G1P0 with IUP at 3514w6d presenting for rule out labor secondary to increased contractions. She says that she initially felt contractions around 4:30 am with increasing contraction frequency and intensity to q 10 minutes around 10:30 am. She denies vaginal bleeding, loss of fluids. She has +FM. She is GBS positive. She has no other complaints. She would like to try for NSVD without pain control, however is open to pain control if the pain becomes unbearable.    Past Medical History: History reviewed. No pertinent past medical history.  Past Surgical History: Past Surgical History  Procedure Laterality Date  . Wisdom tooth extraction      Obstetrical History: OB History   Grav Para Term Preterm Abortions TAB SAB Ect Mult Living   1               Gynecological History: OB History   Grav Para Term Preterm Abortions TAB SAB Ect Mult Living   1               Social History: History   Social History  . Marital Status: Married    Spouse Name: N/A    Number of Children: N/A  . Years of Education: N/A   Occupational History  . dietician    Social History Main Topics  . Smoking status: Former Smoker -- 0.25 packs/day for 5 years    Types: Cigarettes    Quit date: 07/12/2011  . Smokeless tobacco: None  . Alcohol Use: Yes     Comment: occ  . Drug Use: No  . Sexual Activity: Yes    Partners: Male   Other Topics Concern  . None   Social History Narrative  . None    Family History: Family History  Problem Relation Age of Onset  . Mitral valve prolapse Mother   . Depression Mother   .  Depression Father   . Asthma Father   . Asthma Sister   . Depression Sister     Post partum  . Chronic bronchitis Father   . Cancer - Lung Paternal Grandfather     Allergies: No Known Allergies  Facility-administered medications prior to admission  Medication Dose Route Frequency Provider Last Rate Last Dose  . influenza vac split quadrivalent PF (FLUARIX) injection 0.5 mL  0.5 mL Intramuscular Once Lesly DukesKelly H Leggett, MD       Prescriptions prior to admission  Medication Sig Dispense Refill  . cholecalciferol (VITAMIN D) 1000 UNITS tablet Take 2,000 Units by mouth daily.      . Prenatal Vit-Fe Fumarate-FA (MULTIVITAMIN-PRENATAL) 27-0.8 MG TABS tablet Take 1 tablet by mouth daily at 12 noon.         Review of Systems   Constitutional: HPI as above.   Height 5\' 9"  (1.753 m), weight 82.668 kg (182 lb 4 oz), last menstrual period 05/28/2013, not currently breastfeeding. General appearance: alert, cooperative and no distress Lungs: clear to auscultation bilaterally Heart: regular rate and rhythm Abdomen: soft, non-tender; bowel sounds normal Pelvic: 5.5cm, 80, -1 Extremities: Homans sign is negative, no sign of DVT Neurologically grossly intact.  Presentation: cephalic Fetal monitoringBaseline: 130's bpm, Variability:  Good {> 6 bpm) and Accelerations: Reactive Uterine activityFrequency: Every 10-15 minutes and Duration: 20 seconds Dilation: 5.5 Effacement (%): 80 Station: -1 Exam by:: K.Wilson,RN   Prenatal labs: ABO, Rh: A/POS/-- (11/05 1809) Antibody: NEG (11/05 1809) Rubella:   RPR: NON REAC (04/10 0929)  HBsAg: NEGATIVE (11/05 1809)  HIV: NONREACTIVE (04/10 0929)  GBS: Positive (06/24 0000)   Clinic KV  Dating LMP:                 Ultrasound:   6.5 weeks        Ultrasound consistent with LMP: Yes  Genetic Screen  declines           Anatomic US Normal female. Incomplete views of heart. F/U 6 wks  GTT               Third trimester: 74  TDaP vaccine  12/19/13  Flu  vaccine 08/2013  GBS  Positive  Baby Food Breast definitely!!!  Contraception  Likely Mirena  Circumcision Probably  Editor, commissioningediatrician  [facility]  Support Person  Husband     Prenatal Transfer Tool  Maternal Diabetes: No Genetic Screening: Deferred Maternal Ultrasounds/Referrals: Normal Fetal Ultrasounds or other Referrals:  None Maternal Substance Abuse:  No Significant Maternal Medications:  None Significant Maternal Lab Results: Lab values include: Group B Strep positive     Results for orders placed during the hospital encounter of 03/09/14 (from the past 24 hour(s))  URINALYSIS, ROUTINE W REFLEX MICROSCOPIC   Collection Time    03/09/14  3:46 PM      Result Value Ref Range   Color, Urine YELLOW  YELLOW   APPearance CLEAR  CLEAR   Specific Gravity, Urine <1.005 (*) 1.005 - 1.030   pH 6.5  5.0 - 8.0   Glucose, UA NEGATIVE  NEGATIVE mg/dL   Hgb urine dipstick NEGATIVE  NEGATIVE   Bilirubin Urine NEGATIVE  NEGATIVE   Ketones, ur 15 (*) NEGATIVE mg/dL   Protein, ur NEGATIVE  NEGATIVE mg/dL   Urobilinogen, UA 0.2  0.0 - 1.0 mg/dL   Nitrite NEGATIVE  NEGATIVE   Leukocytes, UA NEGATIVE  NEGATIVE    Assessment: Meghan Delacruz is a 28 y.o. G1P0 at 5758w6d by L= 6.5 wk ultrasound here for onset of labor.  #Labor: Plan for admission with expectant mgmt  #Pain: Will attempt without Fentanyl, or epidural at this time. Will place anesthesia on board if pt. Changes her mind.  #FWB: Cat I #ID:  GBS Pos. - PCN ppx on board #MOF: Breast #MOC: possibly Mirena #Circ:  Yes  Melancon, Caleb G 03/09/2014, 5:01 PM   I spoke with and examined patient and agree with resident/PA/SNM's note and plan of care.  Cheral MarkerKimberly R. Arie Powell, CNM, [facility] 03/09/2014 5:29 PM

## 2014-03-09 NOTE — MAU Note (Signed)
Pt states was 1cm two weeks ago, and was not checked at last appointment. Denies bleeding or lof.

## 2014-03-09 NOTE — MAU Note (Signed)
Per Christine, RN charge, pt to go to room 166.  

## 2014-03-10 NOTE — Progress Notes (Addendum)
Post Partum Day 1 Subjective: no complaints, up ad lib, voiding and tolerating PO No concerns currently  Objective: Blood pressure 124/68, pulse 88, temperature 98 F (36.7 C), temperature source Oral, resp. rate 20, height 5\' 9"  (1.753 m), weight 82.668 kg (182 lb 4 oz), last menstrual period 05/28/2013, unknown if currently breastfeeding.  Physical Exam:  General: alert, cooperative and appears stated age Lochia: appropriate Uterine Fundus: firm Incision: n/a DVT Evaluation: No evidence of DVT seen on physical exam. No cords or calf tenderness.   Recent Labs  03/09/14 1635  HGB 13.0  HCT 36.0    Assessment/Plan: Doing well currently Plan for discharge tomorrow, Breastfeeding, Circumcision prior to discharge and Contraception mirena   LOS: 1 day   Anselm LisMarsh, Melanie 03/10/2014, 8:32 AM   I have seen and examined this patient and agree with above documentation in the resident's note.   Circumcision Counseling Progress Note  Patient desires circumcision for her female infant.  Circumcision procedure details discussed, risks and benefits of procedure were also discussed.  These include but are not limited to: Benefits of circumcision in men include reduction in the rates of urinary tract infection (UTI), penile cancer, some sexually transmitted infections, penile inflammatory and retractile disorders, as well as easier hygiene.  Risks include bleeding , infection, injury of glans which may lead to penile deformity or urinary tract issues, unsatisfactory cosmetic appearance and other potential complications related to the procedure.  It was emphasized that this is an elective procedure.  Patient wants to proceed with circumcision; written informed consent obtained.  Routine circumcision and post circumcision care ordered for the infant.  Rulon AbideKeli Aza Dantes, M.D. [facility] 03/10/2014 8:53 AM    Rulon AbideKeli Mildred Tuccillo, M.D. [facility] 03/10/2014 8:52 AM

## 2014-03-10 NOTE — Lactation Note (Signed)
This note was copied from the chart of [facility]. Lactation Consultation Note: Initial visit with mom. Baby now 1615 hours old and is in nursery for circ at this time, Reviewed normal behavior after circ and cluster feeding. Mom reports that he has been nursing well- having a little trouble on left breast but he has latched to that breast. LS 9 by RN. 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 BF brochure given with resources for support after DC. No questions at present. To call prn  Patient Name: Meghan Delacruz HYQMV'HToday's Date: 03/10/2014 Reason for consult: Initial assessment   Maternal Data Formula Feeding for Exclusion: No Infant to breast within first hour of birth: Yes Has patient been taught Hand Expression?: Yes Does the patient have breastfeeding experience prior to this delivery?: No  Feeding Feeding Type: Breast Fed Length of feed: 25 min  LATCH Score/Interventions Latch: Grasps breast easily, tongue down, lips flanged, rhythmical sucking.  Audible Swallowing: A few with stimulation Intervention(s): Skin to skin  Type of Nipple: Everted at rest and after stimulation  Comfort (Breast/Nipple): Soft / non-tender     Hold (Positioning): No assistance needed to correctly position infant at breast.  LATCH Score: 9  Lactation Tools Discussed/Used     Consult Status Consult Status: Follow-up Date: 03/11/14 Follow-up type: In-patient    Meghan Delacruz, Meghan Delacruz 03/10/2014, 10:24 AM

## 2014-03-11 ENCOUNTER — Encounter: Payer: PRIVATE HEALTH INSURANCE | Admitting: Family

## 2014-03-11 MED ORDER — IBUPROFEN 600 MG PO TABS: 600.0000 mg | ORAL_TABLET | Freq: Four times a day (QID) | ORAL | Status: DC

## 2014-03-11 NOTE — Discharge Summary (Signed)
Attestation of Attending Supervision of Advanced Practitioner (CNM/NP): Evaluation and management procedures were performed by the Advanced Practitioner under my supervision and collaboration.  I have reviewed the Advanced Practitioner's note and chart, and I agree with the management and plan.  Saadiya Wilfong 03/11/2014 8:12 AM

## 2014-03-11 NOTE — Discharge Summary (Signed)
Obstetric Discharge Summary Reason for Admission: onset of labor Prenatal Procedures: none Intrapartum Procedures: spontaneous vaginal delivery and GBS prophylaxis Postpartum Procedures: none Complications-Operative and Postpartum: 2nd degree perineal laceration; periurethral Hemoglobin  Date Value Ref Range Status  03/09/2014 13.0  12.0 - 15.0 g/dL Final     HCT  Date Value Ref Range Status  03/09/2014 36.0  36.0 - 46.0 % Final   Ms Meghan Delacruz is a 27yo G1 admitted in the early evening of 03/09/14 in active labor at 39.6wks. She received one dose of PCN G prior to delivery as she progressed quickly to SVD within approx 2 hours of admission. Her postpartum course has been uneventful and by PPD#2 she is deemed to have received the full benefit of her hospital stay and will be discharged home. Her son was circumcised yesterday. She is breastfeeding and plans on the Mirena for contraception.  Physical Exam:  General: alert, cooperative and no distress Heart: RRR Lungs: nl effort Lochia: appropriate Uterine Fundus: firm DVT Evaluation: No evidence of DVT seen on physical exam.  Discharge Diagnoses: Term Pregnancy-delivered  Discharge Information: Date: 03/11/2014 Activity: pelvic rest Diet: routine Medications: PNV and Ibuprofen Condition: stable Instructions: refer to practice specific booklet Discharge to: home Follow-up Information   [facility] [place] Schedule an appointment as soon as possible for a visit in 4 weeks. (For your postpartum appointment.)    Specialty:  Obstetrics and Gynecology   Contact information:   [address], [address] [place] [id number] [phone]      Newborn Data: Live born female  Birth Weight: 8 lb 5.5 oz (3785 g) APGAR: 8, 9  Home with mother.  Cam HaiSHAW, Taylon Louison CNM 03/11/2014, 7:35 AM

## 2014-03-11 NOTE — Discharge Instructions (Signed)
Vaginal Delivery °Care After °Refer to this sheet in the next few weeks. These discharge instructions provide you with information on caring for yourself after delivery. Your caregiver may also give you specific instructions. Your treatment has been planned according to the most current medical practices available, but problems sometimes occur. Call your caregiver if you have any problems or questions after you go home. °HOME CARE INSTRUCTIONS °· Take over-the-counter or prescription medicines only as directed by your caregiver or pharmacist. °· Do not drink alcohol, especially if you are breastfeeding or taking medicine to relieve pain. °· Do not chew or smoke tobacco. °· Do not use illegal drugs. °· Continue to use good perineal care. Good perineal care includes: °¨ Wiping your perineum from front to back. °¨ Keeping your perineum clean. °· Do not use tampons or douche until your caregiver says it is okay. °· Shower, wash your hair, and take tub baths as directed by your caregiver. °· Wear a well-fitting bra that provides breast support. °· Eat healthy foods. °· Drink enough fluids to keep your urine clear or pale yellow. °· Eat high-fiber foods such as whole grain cereals and breads, brown rice, beans, and fresh fruits and vegetables every day. These foods may help prevent or relieve constipation. °· Follow your cargiver's recommendations regarding resumption of activities such as climbing stairs, driving, lifting, exercising, or traveling. °· Talk to your caregiver about resuming sexual activities. Resumption of sexual activities is dependent upon your risk of infection, your rate of healing, and your comfort and desire to resume sexual activity. °· Try to have someone help you with your household activities and your newborn for at least a few days after you leave the hospital. °· Rest as much as possible. Try to rest or take a nap when your newborn is sleeping. °· Increase your activities gradually. °· Keep all  of your scheduled postpartum appointments. It is very important to keep your scheduled follow-up appointments. At these appointments, your caregiver will be checking to make sure that you are healing physically and emotionally. °SEEK MEDICAL CARE IF:  °· You are passing large clots from your vagina. Save any clots to show your caregiver. °· You have a foul smelling discharge from your vagina. °· You have trouble urinating. °· You are urinating frequently. °· You have pain when you urinate. °· You have a change in your bowel movements. °· You have increasing redness, pain, or swelling near your vaginal incision (episiotomy) or vaginal tear. °· You have pus draining from your episiotomy or vaginal tear. °· Your episiotomy or vaginal tear is separating. °· You have painful, hard, or reddened breasts. °· You have a severe headache. °· You have blurred vision or see spots. °· You feel sad or depressed. °· You have thoughts of hurting yourself or your newborn. °· You have questions about your care, the care of your newborn, or medicines. °· You are dizzy or lightheaded. °· You have a rash. °· You have nausea or vomiting. °· You were breastfeeding and have not had a menstrual period within 12 weeks after you stopped breastfeeding. °· You are not breastfeeding and have not had a menstrual period by the 12th week after delivery. °· You have a fever. °SEEK IMMEDIATE MEDICAL CARE IF:  °· You have persistent pain. °· You have chest pain. °· You have shortness of breath. °· You faint. °· You have leg pain. °· You have stomach pain. °· Your vaginal bleeding saturates two or more sanitary pads   in 1 hour. °MAKE SURE YOU:  °· Understand these instructions. °· Will watch your condition. °· Will get help right away if you are not doing well or get worse. ° ° °Document Released: 08/20/2000 Document Revised: 05/17/2012 Document Reviewed: 04/19/2012 °Exit[employer]® Patient Information ©2015 [employer]. This information is not intended to  replace advice given to you by your health care provider. Make sure you discuss any questions you have with your health care provider. ° °

## 2014-04-15 ENCOUNTER — Encounter: Payer: Self-pay | Admitting: Advanced Practice Midwife

## 2014-04-15 ENCOUNTER — Ambulatory Visit (INDEPENDENT_AMBULATORY_CARE_PROVIDER_SITE_OTHER): Payer: BC Managed Care – PPO | Admitting: Advanced Practice Midwife

## 2014-04-15 NOTE — Patient Instructions (Signed)
Levonorgestrel intrauterine device (IUD) What is this medicine? LEVONORGESTREL IUD (LEE voe nor jes trel) is a contraceptive (birth control) device. The device is placed inside the uterus by a healthcare professional. It is used to prevent pregnancy and can also be used to treat heavy bleeding that occurs during your period. Depending on the device, it can be used for 3 to 5 years. This medicine may be used for other purposes; ask your health care provider or pharmacist if you have questions. COMMON BRAND NAME(S): LILETTA, Mirena, Skyla What should I tell my health care provider before I take this medicine? They need to know if you have any of these conditions: -abnormal Pap smear -cancer of the breast, uterus, or cervix -diabetes -endometritis -genital or pelvic infection now or in the past -have more than one sexual partner or your partner has more than one partner -heart disease -history of an ectopic or tubal pregnancy -immune system problems -IUD in place -liver disease or tumor -problems with blood clots or take blood-thinners -use intravenous drugs -uterus of unusual shape -vaginal bleeding that has not been explained -an unusual or allergic reaction to levonorgestrel, other hormones, silicone, or polyethylene, medicines, foods, dyes, or preservatives -pregnant or trying to get pregnant -breast-feeding How should I use this medicine? This device is placed inside the uterus by a health care professional. Talk to your pediatrician regarding the use of this medicine in children. Special care may be needed. Overdosage: If you think you have taken too much of this medicine contact a poison control center or emergency room at once. NOTE: This medicine is only for you. Do not share this medicine with others. What if I miss a dose? This does not apply. What may interact with this medicine? Do not take this medicine with any of the following  medications: -amprenavir -bosentan -fosamprenavir This medicine may also interact with the following medications: -aprepitant -barbiturate medicines for inducing sleep or treating seizures -bexarotene -griseofulvin -medicines to treat seizures like carbamazepine, ethotoin, felbamate, oxcarbazepine, phenytoin, topiramate -modafinil -pioglitazone -rifabutin -rifampin -rifapentine -some medicines to treat HIV infection like atazanavir, indinavir, lopinavir, nelfinavir, tipranavir, ritonavir -St. John's wort -warfarin This list may not describe all possible interactions. Give your health care provider a list of all the medicines, herbs, non-prescription drugs, or dietary supplements you use. Also tell them if you smoke, drink alcohol, or use illegal drugs. Some items may interact with your medicine. What should I watch for while using this medicine? Visit your doctor or health care professional for regular check ups. See your doctor if you or your partner has sexual contact with others, becomes HIV positive, or gets a sexual transmitted disease. This product does not protect you against HIV infection (AIDS) or other sexually transmitted diseases. You can check the placement of the IUD yourself by reaching up to the top of your vagina with clean fingers to feel the threads. Do not pull on the threads. It is a good habit to check placement after each menstrual period. Call your doctor right away if you feel more of the IUD than just the threads or if you cannot feel the threads at all. The IUD may come out by itself. You may become pregnant if the device comes out. If you notice that the IUD has come out use a backup birth control method like condoms and call your health care provider. Using tampons will not change the position of the IUD and are okay to use during your period. What side effects may   I notice from receiving this medicine? Side effects that you should report to your doctor or  health care professional as soon as possible: -allergic reactions like skin rash, itching or hives, swelling of the face, lips, or tongue -fever, flu-like symptoms -genital sores -high blood pressure -no menstrual period for 6 weeks during use -pain, swelling, warmth in the leg -pelvic pain or tenderness -severe or sudden headache -signs of pregnancy -stomach cramping -sudden shortness of breath -trouble with balance, talking, or walking -unusual vaginal bleeding, discharge -yellowing of the eyes or skin Side effects that usually do not require medical attention (report to your doctor or health care professional if they continue or are bothersome): -acne -breast pain -change in sex drive or performance -changes in weight -cramping, dizziness, or faintness while the device is being inserted -headache -irregular menstrual bleeding within first 3 to 6 months of use -nausea This list may not describe all possible side effects. Call your doctor for medical advice about side effects. You may report side effects to FDA at 1-800-FDA-1088. Where should I keep my medicine? This does not apply. NOTE: This sheet is a summary. It may not cover all possible information. If you have questions about this medicine, talk to your doctor, pharmacist, or health care provider.  2015, [employer] Standard. (2011-09-23 13:54:04)  

## 2014-04-15 NOTE — Progress Notes (Signed)
  Subjective:     Meghan Delacruz is a 28 y.o. female who presents for a postpartum visit. She is 5 weeks postpartum following a spontaneous vaginal delivery. I have fully reviewed the prenatal and intrapartum course. The delivery was at [redacted] weeks gestational weeks. Outcome: spontaneous vaginal delivery. Anesthesia: local. Postpartum course has been normal. Baby's course has been normal. Baby is feeding by breast. Bleeding no bleeding. Bowel function is normal. Bladder function is normal. Patient is not sexually active. Contraception method is abstinence. Postpartum depression screening: negative.  The following portions of the patient's history were reviewed and updated as appropriate: allergies, current medications, past family history, past medical history, past social history, past surgical history and problem list.  Review of Systems Pertinent items are noted in HPI.   Objective:    BP 120/83  Pulse 98  Resp 15  Ht 5\' 7"  (1.702 m)  Wt 154 lb (69.854 kg)  BMI 24.11 kg/m2  Breastfeeding? Yes  General:  alert, cooperative, appears stated age and no distress   Breasts:  inspection negative, no nipple discharge or bleeding, no masses or nodularity palpable  Lungs: clear to auscultation bilaterally  Heart:  regular rate and rhythm, S1, S2 normal, no murmur, click, rub or gallop  Abdomen: soft, non-tender; bowel sounds normal; no masses,  no organomegaly   Vulva:  not evaluated  Vagina: not evaluated  Cervix:  not evalauted  Corpus: not examined  Adnexa:  not evaluated  Rectal Exam: Not performed.        Assessment:     Normal postpartum exam. Pap smear not done at today's visit.   Plan:    1. Contraception: condoms, then IUD. Will call to schedule 2. Follow up in: 3 months for Pap or as needed.

## 2014-07-08 ENCOUNTER — Encounter: Payer: Self-pay | Admitting: Advanced Practice Midwife

## 2016-07-16 ENCOUNTER — Encounter: Payer: Self-pay | Admitting: *Deleted

## 2016-07-16 ENCOUNTER — Encounter: Payer: Self-pay | Admitting: Advanced Practice Midwife

## 2016-07-16 ENCOUNTER — Ambulatory Visit (INDEPENDENT_AMBULATORY_CARE_PROVIDER_SITE_OTHER): Payer: Self-pay | Admitting: Advanced Practice Midwife

## 2016-07-16 VITALS — BP 110/71 | HR 117 | Ht 68.0 in | Wt 143.0 lb

## 2016-07-16 DIAGNOSIS — Z01419 Encounter for gynecological examination (general) (routine) without abnormal findings: Secondary | ICD-10-CM | POA: Diagnosis not present

## 2016-07-16 DIAGNOSIS — Z1151 Encounter for screening for human papillomavirus (HPV): Secondary | ICD-10-CM | POA: Diagnosis not present

## 2016-07-16 DIAGNOSIS — Z3202 Encounter for pregnancy test, result negative: Secondary | ICD-10-CM | POA: Diagnosis not present

## 2016-07-16 DIAGNOSIS — Z124 Encounter for screening for malignant neoplasm of cervix: Secondary | ICD-10-CM | POA: Diagnosis not present

## 2016-07-16 DIAGNOSIS — Z3009 Encounter for other general counseling and advice on contraception: Secondary | ICD-10-CM | POA: Insufficient documentation

## 2016-07-16 DIAGNOSIS — Z30430 Encounter for insertion of intrauterine contraceptive device: Secondary | ICD-10-CM | POA: Diagnosis not present

## 2016-07-16 DIAGNOSIS — Z30014 Encounter for initial prescription of intrauterine contraceptive device: Secondary | ICD-10-CM

## 2016-07-16 LAB — POCT URINE PREGNANCY: Preg Test, Ur: NEGATIVE

## 2016-07-16 MED ORDER — LEVONORGESTREL 20 MCG/24HR IU IUD
INTRAUTERINE_SYSTEM | Freq: Once | INTRAUTERINE | Status: AC
  Administered 2016-07-16: 10:00:00 via INTRAUTERINE

## 2016-07-16 NOTE — Patient Instructions (Signed)
Levonorgestrel intrauterine device (IUD) What is this medicine? LEVONORGESTREL IUD (LEE voe nor jes trel) is a contraceptive (birth control) device. The device is placed inside the uterus by a healthcare professional. It is used to prevent pregnancy and can also be used to treat heavy bleeding that occurs during your period. Depending on the device, it can be used for 3 to 5 years. This medicine may be used for other purposes; ask your health care provider or pharmacist if you have questions. What should I tell my health care provider before I take this medicine? They need to know if you have any of these conditions: -abnormal Pap smear -cancer of the breast, uterus, or cervix -diabetes -endometritis -genital or pelvic infection now or in the past -have more than one sexual partner or your partner has more than one partner -heart disease -history of an ectopic or tubal pregnancy -immune system problems -IUD in place -liver disease or tumor -problems with blood clots or take blood-thinners -use intravenous drugs -uterus of unusual shape -vaginal bleeding that has not been explained -an unusual or allergic reaction to levonorgestrel, other hormones, silicone, or polyethylene, medicines, foods, dyes, or preservatives -pregnant or trying to get pregnant -breast-feeding How should I use this medicine? This device is placed inside the uterus by a health care professional. Talk to your pediatrician regarding the use of this medicine in children. Special care may be needed. Overdosage: If you think you have taken too much of this medicine contact a poison control center or emergency room at once. NOTE: This medicine is only for you. Do not share this medicine with others. What if I miss a dose? This does not apply. What may interact with this medicine? Do not take this medicine with any of the following medications: -amprenavir -bosentan -fosamprenavir This medicine may also interact with  the following medications: -aprepitant -barbiturate medicines for inducing sleep or treating seizures -bexarotene -griseofulvin -medicines to treat seizures like carbamazepine, ethotoin, felbamate, oxcarbazepine, phenytoin, topiramate -modafinil -pioglitazone -rifabutin -rifampin -rifapentine -some medicines to treat HIV infection like atazanavir, indinavir, lopinavir, nelfinavir, tipranavir, ritonavir -St. John's wort -warfarin This list may not describe all possible interactions. Give your health care provider a list of all the medicines, herbs, non-prescription drugs, or dietary supplements you use. Also tell them if you smoke, drink alcohol, or use illegal drugs. Some items may interact with your medicine. What should I watch for while using this medicine? Visit your doctor or health care professional for regular check ups. See your doctor if you or your partner has sexual contact with others, becomes HIV positive, or gets a sexual transmitted disease. This product does not protect you against HIV infection (AIDS) or other sexually transmitted diseases. You can check the placement of the IUD yourself by reaching up to the top of your vagina with clean fingers to feel the threads. Do not pull on the threads. It is a good habit to check placement after each menstrual period. Call your doctor right away if you feel more of the IUD than just the threads or if you cannot feel the threads at all. The IUD may come out by itself. You may become pregnant if the device comes out. If you notice that the IUD has come out use a backup birth control method like condoms and call your health care provider. Using tampons will not change the position of the IUD and are okay to use during your period. What side effects may I notice from receiving this medicine?   Side effects that you should report to your doctor or health care professional as soon as possible: -allergic reactions like skin rash, itching or  hives, swelling of the face, lips, or tongue -fever, flu-like symptoms -genital sores -high blood pressure -no menstrual period for 6 weeks during use -pain, swelling, warmth in the leg -pelvic pain or tenderness -severe or sudden headache -signs of pregnancy -stomach cramping -sudden shortness of breath -trouble with balance, talking, or walking -unusual vaginal bleeding, discharge -yellowing of the eyes or skin Side effects that usually do not require medical attention (report to your doctor or health care professional if they continue or are bothersome): -acne -breast pain -change in sex drive or performance -changes in weight -cramping, dizziness, or faintness while the device is being inserted -headache -irregular menstrual bleeding within first 3 to 6 months of use -nausea This list may not describe all possible side effects. Call your doctor for medical advice about side effects. You may report side effects to FDA at 1-800-FDA-1088. Where should I keep my medicine? This does not apply. NOTE: This sheet is a summary. It may not cover all possible information. If you have questions about this medicine, talk to your doctor, pharmacist, or health care provider.    2016, [employer] Standard. (2011-09-23 13:54:04)  

## 2016-07-16 NOTE — Progress Notes (Signed)
ds

## 2016-07-16 NOTE — Progress Notes (Signed)
Subjective:    Meghan Delacruz is a 30 y.o. female who presents for an annual exam. The patient has no complaints today. The patient is sexually active. GYN screening history: last pap: was abnormal: Normal Pap but high risk HPV. The patient wears seatbelts: yes. The patient participates in regular exercise: yes. Has the patient ever been transfused or tattooed?: yes. The patient reports that there is not domestic violence in her life.   Menstrual History: OB History    Gravida Para Term Preterm AB Living   1 1 1     1    SAB TAB Ectopic Multiple Live Births           1       Patient's last menstrual period was 06/28/2016.    The following portions of the patient's history were reviewed and updated as appropriate: allergies, current medications, past family history, past medical history, past social history, past surgical history and problem list.  Review of Systems A comprehensive review of systems was negative.    Objective:   BP 110/71   Pulse (!) 117   Ht 5\' 8"  (1.727 m)   Wt 143 lb (64.9 kg)   LMP 06/28/2016   Breastfeeding? Yes   BMI 21.74 kg/m  CONSTITUTIONAL: Well-developed, well-nourished female in no acute distress.  HENT:  Normocephalic, atraumatic, External right and left ear normal. Oropharynx is clear and moist EYES: Conjunctivae and EOM are normal. Pupils are equal, round, and reactive to light. No scleral icterus.  NECK: Normal range of motion, supple, no masses.  Normal thyroid.  SKIN: Skin is warm and dry. No rash noted. Not diaphoretic. No erythema. No pallor. NEUROLGIC: Alert and oriented to person, place, and time. Normal reflexes, muscle tone coordination. No cranial nerve deficit noted. PSYCHIATRIC: Normal mood and affect. Normal behavior. Normal judgment and thought content. CARDIOVASCULAR: Normal heart rate noted, regular rhythm RESPIRATORY: Clear to auscultation bilaterally. Effort and breath sounds normal, no problems with respiration noted. BREASTS:  Symmetric in size. No masses, skin changes, nipple drainage, or lymphadenopathy. ABDOMEN: Soft, normal bowel sounds, no distention noted.  No tenderness, rebound or guarding.  PELVIC: Normal appearing external genitalia; normal appearing vaginal mucosa and cervix.  Normal appearing discharge.  Pap smear obtained.  Normal uterine size, no other palpable masses, no uterine or adnexal tenderness. MUSCULOSKELETAL: Normal range of motion. No tenderness.  No cyanosis, clubbing, or edema.  2+ distal pulses.  IUD Procedure Note Patient identified, informed consent performed.  Discussed risks of irregular bleeding, cramping, infection, malpositioning or misplacement of the IUD outside the uterus which may require further procedures. Time out was performed.  Urine pregnancy test negative.  Speculum placed in the vagina.  Cervix visualized.  Cleaned with Betadine x 2.  Grasped anteriorly with a single tooth tenaculum.  Uterus sounded to 8 cm.  Mirena IUD placed per manufacturer's recommendations.  Strings trimmed to 3 cm. Tenaculum was removed, good hemostasis noted.  Patient tolerated procedure well.   Patient was given post-procedure instructions and the Mirena care card with expiration date.  Patient was also asked to check IUD strings periodically and follow up in 4-6 weeks for IUD check.   Assessment:   1. Encounter for initial prescription of intrauterine contraceptive device (IUD)  - levonorgestrel (MIRENA) 20 MCG/24HR IUD; by Intrauterine route once. - POCT urine pregnancy  2. Encounter for counseling regarding contraception --Discussed LARCs as most effective forms of birth control. Questions answered about all forms of contraception.  Pt breastfeeding her 30  year old, has tried OCPs and Depo and does not want these. Mirena selected by pt as best option given choices.  Placement today without difficult, see note above.    3. Encounter for IUD insertion   4. 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 Pap results pending.   LEFTWICH-KIRBY, Izmael Duross, CNM 10:17 AM

## 2016-07-19 LAB — CYTOLOGY - PAP
Diagnosis: NEGATIVE
HPV (WINDOPATH): NOT DETECTED

## 2016-08-13 ENCOUNTER — Encounter: Payer: Self-pay | Admitting: Family

## 2016-08-13 ENCOUNTER — Ambulatory Visit (INDEPENDENT_AMBULATORY_CARE_PROVIDER_SITE_OTHER): Payer: PRIVATE HEALTH INSURANCE | Admitting: Family

## 2016-08-13 VITALS — BP 124/86 | HR 97 | Wt 141.0 lb

## 2016-08-13 DIAGNOSIS — Z30431 Encounter for routine checking of intrauterine contraceptive device: Secondary | ICD-10-CM

## 2016-08-13 NOTE — Progress Notes (Signed)
   Subjective:    Patient ID: Meghan Delacruz, female    DOB: 1986-03-19, 30 y.o.   MRN: [id number]  HPI Meghan Delacruz is here with report of feeling increased pelvic pain with intercourse.  Reports only having intercourse once since its been placed.  Felt like something was "tearing" or that the IUD was being reinserted.  Also reports bleeding x 2 weeks with menses.  Describes it more as spotting.  No issues with string sensation.     Review of Systems Pertinent information in HPI    Objective:   Physical Exam  Constitutional: She is oriented to person, place, and time. She appears well-developed and well-nourished.  HENT:  Head: Normocephalic.  Neck: Normal range of motion. Neck supple.  Abdominal: Soft. There is no tenderness.  Genitourinary: Cervix exhibits no motion tenderness, no discharge and no friability.  Genitourinary Comments: IUD string visualized; strings shorter than expected, seen slightly protruding from os;  device not palpated at the os with manual check; unable to reproduce pain with pelvic exam  Neurological: She is alert and oriented to person, place, and time.  Skin: Skin is warm and dry.      Assessment & Plan:  IUD Check  Plan: Will continue with IUD If pain continues, partner plans to get a vasectomy  Meghan Delacruz, CNM

## 2016-09-01 ENCOUNTER — Ambulatory Visit: Payer: PRIVATE HEALTH INSURANCE | Admitting: Obstetrics & Gynecology

## 2016-09-17 ENCOUNTER — Telehealth: Payer: Self-pay

## 2016-09-17 NOTE — Telephone Encounter (Signed)
Pt states that she had a Mirena inserted in November and she has had periods and spotting since then. She asked if this is normal. I told her that she can have bleeding/spotting for up to maybe 6 months. I assured her that this is not the case for everyone but, sometimes it can take around that long. I told her if she would like to see a doctor then I can make her an appt. She did not want to make an appt.

## 2020-08-18 ENCOUNTER — Ambulatory Visit: Payer: PRIVATE HEALTH INSURANCE | Admitting: Medical-Surgical

## 2020-09-12 ENCOUNTER — Ambulatory Visit (INDEPENDENT_AMBULATORY_CARE_PROVIDER_SITE_OTHER): Payer: PRIVATE HEALTH INSURANCE | Admitting: Medical-Surgical

## 2020-09-12 ENCOUNTER — Encounter: Payer: Self-pay | Admitting: Medical-Surgical

## 2020-09-12 ENCOUNTER — Other Ambulatory Visit: Payer: Self-pay

## 2020-09-12 VITALS — BP 102/65 | HR 90 | Temp 98.9°F | Ht 68.5 in | Wt 134.1 lb

## 2020-09-12 DIAGNOSIS — N926 Irregular menstruation, unspecified: Secondary | ICD-10-CM

## 2020-09-12 DIAGNOSIS — F418 Other specified anxiety disorders: Secondary | ICD-10-CM | POA: Diagnosis not present

## 2020-09-12 DIAGNOSIS — Z0000 Encounter for general adult medical examination without abnormal findings: Secondary | ICD-10-CM | POA: Diagnosis not present

## 2020-09-12 DIAGNOSIS — Z7689 Persons encountering health services in other specified circumstances: Secondary | ICD-10-CM

## 2020-09-12 DIAGNOSIS — F410 Panic disorder [episodic paroxysmal anxiety] without agoraphobia: Secondary | ICD-10-CM

## 2020-09-12 LAB — CBC: RDW: 11.2 % (ref 11.0–15.0)

## 2020-09-12 NOTE — Progress Notes (Signed)
New Patient Office Visit  Subjective:  Patient ID: Meghan Delacruz, female    DOB: 10-09-1985  Age: 35 y.o. MRN: [id number]  CC:  Chief Complaint  Patient presents with  . Establish Care    HPI Meghan Delacruz presents to establish care.   Abnormal periods-notes that she started oral contraceptives at the age of 35 because of cycles that were 2 weeks apart.  She had a baby in 2014/15 and notes that her periods returned to normal after the birth.  She had an IUD placed in 2017 and since then has only had occasional very light spotting.  Over the last couple of months, she has noticed an increase in frequency and symptoms including cramping with spotting to light flow.  She does have an OB/GYN but has not yet set up an appointment with them.  Mental health concerns-has a very long family history of mental health issues.  Has had difficulty with anxiety and depression for many years.  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  She has done counseling previously for a while but her counselor moved to another state and she has not been able to find a new one.  She has a history of having panic attacks and once took a half dose of a Klonopin.  After this medication, she slept for greater than 15 hours.  Has a history of anorexia which she was able to resolve on her own.  She now works to control her mental health concerns with exercise and healthy eating.  Unfortunately she is noting that she has been increasingly irritable with her husband and son which is causing marital and parenting issues.  She has been trying to get established with a mood treatment center but no one has called her back despite several inquiries.  Today she would like to have a referral to behavioral health.  She is hesitant to consider antidepressant medication but is open to possible nutritional and herbal supplements to treat her symptoms.  Denies SI/HI.  History  reviewed. No pertinent past medical history.  Past Surgical History:  Procedure Laterality Date  . WISDOM TOOTH EXTRACTION      Family History  Problem Relation Age of Onset  . Mitral valve prolapse Mother   . Depression Mother   . Depression Father   . Asthma Father   . Chronic bronchitis Father   . Asthma Sister   . Depression Sister        Post partum  . Cancer - Lung Paternal Grandfather     Social History   Socioeconomic History  . Marital status: Married    Spouse name: Not on file  . Number of children: Not on file  . Years of education: Not on file  . Highest education level: Not on file  Occupational History  . Occupation: dietician  Tobacco Use  . Smoking status: Former Smoker    Packs/day: 0.25    Years: 5.00    Pack years: 1.25    Types: Cigarettes    Quit date: 07/12/2011    Years since quitting: 9.1  . Smokeless tobacco: Never Used  Vaping Use  . Vaping Use: Never used  Substance and Sexual Activity  . Alcohol use: Yes    Comment: 3  . Drug use: No  . Sexual activity: Yes    Partners: Male    Birth control/protection: I.U.D.  Other Topics Concern  . Not on file  Social History Narrative  . Not on file   Social Determinants of Health   Financial Resource Strain: Not on file  Food Insecurity: Not on file  Transportation Needs: Not on file  Physical Activity: Not on file  Stress: Not on file  Social Connections: Not on file  Intimate Partner Violence: Not on file   ROS Review of Systems  Constitutional: Positive for fatigue. Negative for chills, fever and unexpected weight change.  Respiratory: Negative for cough, chest tightness, shortness of breath and wheezing.   Cardiovascular: Negative for chest pain, palpitations and leg swelling.  Genitourinary: Positive for menstrual problem.  Neurological: Negative for dizziness, light-headedness and headaches.  Psychiatric/Behavioral: Positive for behavioral problems (Increased irritability),  dysphoric mood and sleep disturbance (Intermittent). Negative for self-injury and suicidal ideas. The patient is nervous/anxious.    Depression screen PHQ 2/9 09/12/2020  Decreased Interest 1  Down, Depressed, Hopeless 1  PHQ - 2 Score 2  Altered sleeping 1  Tired, decreased energy 1  Change in appetite 1  Feeling bad or failure about yourself  2  Trouble concentrating 1  Moving slowly or fidgety/restless 0  Suicidal thoughts 0  PHQ-9 Score 8  Difficult doing work/chores Somewhat difficult   GAD 7 : Generalized Anxiety Score 09/12/2020  Nervous, Anxious, on Edge 1  Control/stop worrying 1  Worry too much - different things 1  Trouble relaxing 1  Restless 0  Easily annoyed or irritable 1  Afraid - awful might happen 0  Total GAD 7 Score 5  Anxiety Difficulty Somewhat difficult   Objective:   Today's Vitals: BP 102/65   Pulse 90   Temp 98.9 F (37.2 C)   Ht 5' 8.5" (1.74 m)   Wt 134 lb 1.6 oz (60.8 kg)   SpO2 95%   BMI 20.09 kg/m   Physical Exam Vitals and nursing note reviewed.  Constitutional:      General: She is not in acute distress.    Appearance: Normal appearance.  HENT:     Head: Normocephalic and atraumatic.  Cardiovascular:     Rate and Rhythm: Normal rate and regular rhythm.     Pulses: Normal pulses.     Heart sounds: Normal heart sounds. No murmur heard. No friction rub. No gallop.   Pulmonary:     Effort: Pulmonary effort is normal. No respiratory distress.     Breath sounds: Normal breath sounds. No wheezing.  Skin:    General: Skin is warm and dry.  Neurological:     Mental Status: She is alert and oriented to person, place, and time.  Psychiatric:        Mood and Affect: Mood is anxious and depressed. Affect is tearful.        Speech: Speech normal.        Behavior: Behavior normal. Behavior is cooperative.        Thought Content: Thought content normal.        Cognition and Memory: Cognition and memory normal.        Judgment: Judgment  normal.    Assessment & Plan:   1. Encounter to establish care Reviewed available information and discussed care concerns.   2. Preventative health care Checking preventative labs including lipid panel today.  - Lipid panel  3. Depression with anxiety/panic disorder Checking labs below. Referring to behavioral health for counseling. Discussed possible treatments with medications but wants to try herbal and nutritional approaches first.  Information regarding herbal and nutritional options provided with AVS,  gathered from up-to-date.  Advised patient to review the information and if she desires, try one of the options.  If the first 1 does not work, advised her to be open minded and try other options.  If none of the herbal and nutritional supplementations are helpful for her anxiety/depression symptoms, encouraged her to return for discussion of available medications and recommended treatments - CBC - COMPLETE METABOLIC PANEL WITH GFR - TSH - Ambulatory referral to [facility]  4. Menstrual problem Irregular bleeding and cramping in the setting of an IUD possibly related to laboratory abnormalities but could also be related to stress.  Checking some labs today.  We will work on getting some stress reduction.  If her irregular bleeding and cramping continues, recommend getting scheduled with her OB/GYN for further evaluation.  Outpatient Encounter Medications as of 09/12/2020  Medication Sig  . [DISCONTINUED] cholecalciferol (VITAMIN D) 1000 UNITS tablet Take 2,000 Units by mouth daily.  . [DISCONTINUED] ibuprofen (ADVIL,MOTRIN) 600 MG tablet Take 1 tablet (600 mg total) by mouth every 6 (six) hours.  . [DISCONTINUED] Omega-3 Fatty Acids (FISH OIL) 1000 MG CAPS Take by mouth.  . [DISCONTINUED] Prenatal Vit-Fe Fumarate-FA (MULTIVITAMIN-PRENATAL) 27-0.8 MG TABS tablet Take 1 tablet by mouth daily at 12 noon.   No facility-administered encounter medications on file as of 09/12/2020.     Follow-up: Return in about 4 weeks (around 10/10/2020) for mood follow up.   Clearnce Sorrel, DNP, APRN, FNP-BC [facility]

## 2020-09-12 NOTE — Patient Instructions (Signed)
Kava-kava--Kava-kava or kava (Piper methysticum) has been used for medicinal, religious, and social purposes in various cultures in the [place]. The remedy traditionally comes from the root, which is combined with water to make an emulsion. Western phytopharmaceutical producers may use other parts of the plant to make pills, eg, stems, leaves, peelings, and bark with an ethanol or acetone solvent. The principal active ingredients of kava remedies are called kavalactones; 15 have been identified, all with psychoactive properties. The mechanism of action is not known; research suggests a modulation of GABA activity and inhibition of noradrenaline and dopamine reuptake [9,10]. Two meta-analyses of multiple randomized trials [11-27] found kava to reduce anxiety in patients with elevated symptom levels [28,29]. As an example, a meta-analysis of seven randomized trials of 380 participants with elevated anxiety symptoms found that kava extract reduced anxiety as measured by the [facility] Anxiety (HAM-A) scale compared with participants receiving placebo (weighted mean difference: 3.9, 95% CI 0.1-7.7) [29].Most of the trials had methodological flaws, small sample sizes, and conflicts of interest. Side effects of kava include headaches, sedation, and sleepiness. Numerous reports of severe hepatotoxicity and liver failure have been described in [place] and the [place], occurring within a few weeks to up to two years (average 4.5 months) following ingestion [30-36]. The [place] Food and Drug Administration (FDA) advised that kava should be used with caution in patients with preexisting liver disease or at risk for liver disease [37]. Hepatotoxicity and kava are discussed in further detail separately. (See "Hepatotoxicity due to herbal medications and dietary supplements", section on 'Kava kava'.) Valerian--Valerian root (Valeriana officinalis), first described by Hippocrates, has sedative and anxiolytic  effects. Various compounds have been detected in valerian, including alkaloids, flavonoids, and GABA. Its mechanism of action is not known, but appears to have a relationship with valerian root affinity for the GABA receptor. Valerian was not found to reduce anxiety compared with placebo in a single small trial. A clinical trial randomly assigned 36 patients with generalized anxiety disorder (GAD) to daily valerian, diazepam, or placebo [38]. After four weeks, there were no significant differences on the HAM-A scale or in side effects between patients treated with valerian compared with placebo. Few adverse events have been reported as a result of valerian. Consistent with its sedative properties, valerian can cause drowsiness or dizziness. The risk of respiratory depression should be considered if valerian is proposed for use in the context of multiple sedating drugs and/or significant alcohol consumption. Valerian can cause abdominal pain in large doses. Passion flower--Passion flower (Passiflora incarnata) is traditionally used as a calming herb in the [place] and [place]. Its extract is also used as flavoring in foods and beverages. The anxiolytic effects of passion flower are milder than for kava or valerian; it is thus most common to find passion flower marketed in combination with other herbs. A combined product, Euphytose (EUP), contains passion flower, crataegus, ballota, valerian, cola, and Paullinia. Two small trials of passion flower showed mixed results. As an example, a trial of 90 patients with presurgical anxiety found that patients treated with passion flower experienced less anxiety compared with patients treated with placebo [39]. EUP has not been tested in a randomized trial. Side effects of passion flower are rare but could include drowsiness, sedation, nausea, vomiting, and dizziness. Chamomile--Chamomile (Matricaria recutita) has been used for thousands of years for anxiety as well  as other conditions. Research in animals has found evidence of anti-inflammatory [40], antihyperglycemic [41], antigenotoxic [42], and anticancer [43-45] properties in chamomile; these  findings have not been reproduced in human studies. Clinical trials of chamomile extract for GAD have shown mixed results: ?A clinical trial compared chamomile extract with placebo in 57 patients with mild to moderate GAD, finding the treatment to reduce anxiety on the HAM-A scale compared with the placebo group [46]. ?A clinical trial of 93 patients who responded to chamomile for GAD were randomly assigned to six months of continued chamomile or placebo [47]. At the end of the trial, no difference was seen between groups in the proportion that relapsed. Chamomile has been reported to increase anticoagulant properties of other medications and should thus be used with caution in patients whose anticoagulation status requires international normalized ratio (INR) monitoring. Adverse reactions ranging from simple dermatitis to severe anaphylaxis have also been described [48,49], but large-scale safety data have not been accumulated. Saffron--Saffron is the dried stigmata of Crocus sativus L. In traditional medicine, it is used for the treatment of several conditions including cramps, depression, and asthma. It also has been studied in use for depression. Two clinical trials, both with methodologic limitations, showed greater reductions in anxiety and depression with saffron compared with placebo: ?A clinical trial randomly assigned 28 adult patients with generalized anxiety and depression to receive a 50 mg saffron capsule or a placebo capsule twice daily for 12 weeks [50]. At the end of the trial, patients in the saffron group experienced greater reductions in anxiety and depression compared with placebo. ?A clinical trial of 40 patients with major depression treated with either fluoxetine or sertraline were randomly  assigned to receive crocin, the main ingredient of saffron, or placebo as an adjunct treatment [51]. At the end of the trial, patients receiving crocin showed greater reductions in anxiety and depression compared with placebo. St. John's wort--St. John's wort (Hypericum perforatum) is widely prescribed as an antidepressant and anxiolytic remedy, especially in [place]. The mechanism of action is not known. Weak monoamine oxidase inhibiting (MAOI) properties have been suggested [52]. There is no evidence of efficacy for St. John's wort in anxiety disorders. Small trials suggest the remedy may reduce anxiety associated with other mental disorders (below). It has been tested more extensively for depression. (See "Clinical use of St. John's wort" and "Unipolar depression in adults and initial treatment: Investigational and nonstandard approaches", section on 'St. John's wort'.) ?Obsessive-compulsive disorder/Social anxiety disorder - Clinical trials of St. John's wort for obsessive-compulsive disorder (OCD) and social anxiety disorder reported negative results [53,54]. As an example, a clinical trial randomly assigned 60 patients with OCD to receive St John's wort or placebo. After 12 weeks, no difference in OCD symptom change was seen between groups, nor in the percent of patients responding to treatment (17.9 versus 16.7 percent). ?Anxiety - There are no trials of St. John's wort for anxiety as a primary outcome of treatment. Reduction in associated anxiety has been reported as a secondary outcome in two clinical trials of St. John's wort, one in patients with major depression, the other in patients with somatization disorder [55,56]. The combination of St. John's wort and selective serotonin reuptake inhibitor (SSRI) antidepressants can cause serotonergic syndrome and should be avoided [57]. Other herbals--Other less well-known herbs received some study for anxiety, but generally with significant methodologic  limitations or negative results: ?Keenmind (Bacopa monnieri) [58] ?Lemongrass (Cymbopogon citratus) [59] ?Rhodax (Rhodiola rosea) [60] ?Scullcup (Scutellaria lateriflora) [61] ?Silexan (lavender oil) [62] ?Melissa officianalis leaf extract [63] Borage (Echium amoenum) and milk thistle (Silybum marianum) have shown promising results for OCD in small clinical trials [  64,65]. DIETARY SUPPLEMENTS Essential nutrients and their source--Nutrients, including vitamins, minerals, omega-3 (n-3) polyunsaturated fatty acids (PUFAs), and amino acids, are obtained through dietary intake of fruits, vegetables, nuts, and seafood. Many people are deficient in essential nutrients due to [place] diets high in processed foods [66]. Nutrients are classified as "essential" when they are needed for the body's biologic processes but are not produced in adequate amounts by the body and must be obtained through dietary consumption [66]. Deficient levels of essential nutrients can result in the dysfunction of biological systems and can only be recovered by replenishment of required concentrations [66]. Specific essential nutrients Zinc--Zinc is an essential chemical element and plays an important role in replication, transcription, and protein synthesis for DNA as well as in expression of genes, storage of hormones, and reparation of tissues in humans. Zinc has been found to influence N-methyl-D-aspartate (NMDA) and gamma aminobutyric acid (GABA), neurotransmitter systems believed to be involved in obsessive-compulsive disorder (OCD) and in anxiety disorders [66]. ?Obsessive-compulsive disorder - Study results have led researchers to postulate a relationship between zinc deficiency and OCD: .A study of blood levels of trace elements in 48 participants with OCD and 48 healthy controls found an inverse correlation between the severity of OCD symptoms and zinc levels [67]. Lower levels of iron and magnesium were seen in the OCD  group, but these levels were not correlated with OCD symptom severity [67]. .A small clinical trial found evidence of benefit of zinc supplementation of fluoxetine treatment for OCD. Twenty-three patients with OCD were randomly assigned to receive either fluoxetine (20 mg/day) and zinc (440 mg/day) or fluoxetine (20 mg/day) and placebo [68]. After eight weeks, patients in the supplemented group had lower scores on the [facility] Obsessive-Compulsive Scale (Y-BOCS) compared with those in the control group. ?Panic disorder - A study of trace element concentrations in 54 subjects with panic disorder and 52 healthy persons suggests an association between low levels of zinc and panic disorder [69]. Blood levels of manganese, zinc, calcium, copper, and magnesium were measured in all subjects. Blood concentrations of participants with panic exhibited lower levels of zinc compared with controls; other trace element concentrations were not related to disorder status. Magnesium--Magnesium is obtained through dietary intake of food such as dark, leafy greens; nuts; seeds; avocado; soybeans; and bananas. It is an essential element required for biological homeostasis, glucose metabolism, as well as synthesis of proteins and is a cofactor for over 300 enzymes [66]. Case histories and preclinical studies have found evidence for associations between magnesium deficiency and anxiety and depressive behaviors, as well as improvements in these symptoms after supplementation [66,70]. Vitamin E--Vitamin E (alpha-tocopherol) is an antioxidant that is believed to have a protective effect against free oxygen radicals and lipid peroxidation (a biomarker for stress-induced oxidative cellular damage). Vitamin E has been hypothesized to exert its anxiolytic effect through protective antioxidant effects and decreasing of cortisol levels. Findings from clinical studies include: ?Elevated levels of lipid peroxidation and elevated anxiety  in 80 subjects with OCD, social anxiety disorder, panic disorder [71] ?Decreased vitamin E plasma levels and elevated malondialdehyde levels in 37 subjects with generalized anxiety disorder (GAD) compared with 20 healthy controls [71] Antioxidant vitamins--The antioxidant vitamins A, C, and E have been studied as a group for their effect on anxiety. A study found levels of the vitamins to be lower in 40 patients with GAD and 40 patients with major depression compared with 20 healthy control patients [71]. Participants with GAD in the previous study were randomly  assigned to receive either alprazolam or alprazolam plus vitamins A, C, and E daily [71]. After six weeks, participants who received vitamin supplementation had increased blood levels of the three vitamins and lower scores on the [facility] Anxiety (HAM-A) scale compared with controls. B vitamins--B vitamins are essential for the creation of serotonin neurotransmitters, and vitamin B deficiency has been associated with anxiety and depressive symptoms [72,73]. ?Vitamin B6 and folate - A study of 907 female adolescent subjects found that folate and vitamin B6 were negatively correlated with symptoms of depression and anxiety [74]. ?Vitamin B12 - A study comparing 35 subjects with OCD with 22 healthy controls found a greater proportion with vitamin B12 deficiency in subjects with OCD compared with controls [75]. Subjects with OCD had higher homocysteine levels, which positively correlated with Y-BOCS compulsion and Y-BOCS total scores. Vitamin D--Vitamin D, especially vitamin D3, plays an important role in brain plasticity, neuroimmunomodulation. It is derived from sunshine and fish with higher fat contents [76,77]. Research has suggested a possible relationship between vitamin D deficiency and vulnerability to depression and anxiety. Clinical research findings on the relationship between vitamin D serum levels and anxiety severity have been  mixed: ?A study of 80 patients with chronic hemodialysis reported an association between anxiety severity scores and plasma levels of 25-hydroxy vitamin D [78]. ?In a six-week clinical trial of the effect of vitamin D supplementation on affect and cognition in young adults, no difference was found in anxiety levels between vitamin D recipients and those given placebo [79]. ?A clinical trial of 322 subjects who experienced an earthquake were randomly assigned to receive vitamin D3 or placebo as a treatment for stress and anxiety. After 18 months of treatment, there was no difference between groups in anxiety or stress symptom change [80]. Glycine--Glycine, an NMDA receptor agonist, has been shown to increase glutamatergic neurotransmission, which is known to be dysregulated in individuals with OCD [81]. Glycine has been studied as a potential adjunctive treatment for OCD. A clinical trial of 24 participants with OCD compared adjunctive daily treatment with glycine with placebo over 12 weeks. Participants receiving glycine exhibited a statistically nonsignificant trend toward lower Y-BOCS scores compared with the placebo group. Inositol--Inositol, a naturally occurring isomer of glucose derived from the human diet, is concentrated in the brain and heart and involved in the serotonin, dopamine, and glutamate neurotransmitter systems [82,83]. Small clinical trials suggest that treatment with inositol may be efficacious for panic disorder, agoraphobia, and OCD. ?Panic disorder - A clinical trial with a crossover design compared adjunctive treatment with inositol to placebo over four weeks in 21 patients with a Diagnostic and Statistical Manual of Mental Disorders, Fourth Edition (DSM-IV) diagnosis of panic disorder with and without agoraphobia [82]. Patients receiving inositol experienced a greater reduction in panic attacks compared with placebo (from 10 to 3.7 versus 10 to 6.3 panic attacks) and a decrease in  the severity of agoraphobia. ?Obsessive-compulsive disorder - A clinical trial with a crossover design compared adjunctive inositol with placebo in 13 patients with OCD over six weeks [84]. Patients receiving inositol had lower scores on the Y-BOCS at the end of the period compared with controls. Omega-3 (n-3) polyunsaturated fatty acids--n-3 PUFA, which is found in fish oil, reduced anxiety in small clinical trials, but has not been studied for anxiety disorders. Systematic reviews and meta-analysis have shown conflicting results in the treatment of anxiety symptoms. While one meta-analysis of 19 trials and over 2000 participants showed improvements in severity of anxiety symptoms with n-3 PUFAs, a  larger meta-analysis of 31 trials including over 41,000 participants showed that n-3 PUFAs has little or no effect in preventing anxiety symptoms [85,86]. n-3 PUFA protects neurotransmission from glucocorticoids, which are released during the body's stress response [87]. It is postulated that this protective effect, particularly on excitatory glutamatergic neurotransmitter systems, underlies a potential therapeutic benefit of n-3 PUFA on anxiety. L-lysine and l-arginine--The essential amino acids L-lysine (Lys) and L-arginine (Arg) have been shown to reduce anxiety and stress hormone reactions in individuals with high levels of anxiety [88]. A clinical trial of 107 healthy volunteers were randomly assigned to receive L-lysine and L-arginine or placebo daily [89]. After one week, participants assigned to the L-lysine/L-arginine group had lower levels of state and trait anxiety compared with the placebo group. HOMEOPATHY--Homeopathy, developed by a [place] clinician at the end of the 18th century, involves the administration of highly diluted doses of substances to treat illnesses [90,91]. Meta-analyses of 25 clinical trials of homeopathy for anxiety in samples drawn from diverse populations reported significant  methodologic limitations and inconclusive results [90,91]. Homeopathy is described in greater detail separately. (See "Homeopathy".) AROMATHERAPY--Aromatherapy, the practice of administering isolated volatile oils from plants via inhalation, massage, or internal use, has been utilized in the treatment of anxiety. A systematic review of 16 clinical trials examining the anxiolytic effects of aromatherapy for individuals with anxiety symptoms reported uncertain benefits due to methodologic limitations of the trials [92].

## 2020-09-13 LAB — COMPLETE METABOLIC PANEL WITH GFR
AG Ratio: 2.4 (calc) (ref 1.0–2.5)
ALT: 13 U/L (ref 6–29)
AST: 15 U/L (ref 10–30)
Albumin: 4.8 g/dL (ref 3.6–5.1)
Alkaline phosphatase (APISO): 26 U/L — ABNORMAL LOW (ref 31–125)
BUN: 17 mg/dL (ref 7–25)
CO2: 29 mmol/L (ref 20–32)
Calcium: 9.6 mg/dL (ref 8.6–10.2)
Chloride: 104 mmol/L (ref 98–110)
Creat: 0.75 mg/dL (ref 0.50–1.10)
GFR, Est African American: 120 mL/min/{1.73_m2} (ref >=60)
GFR, Est Non African American: 103 mL/min/{1.73_m2} (ref >=60)
Globulin: 2 g/dL (calc) (ref 1.9–3.7)
Glucose, Bld: 88 mg/dL (ref 65–139)
Potassium: 4 mmol/L (ref 3.5–5.3)
Sodium: 140 mmol/L (ref 135–146)
Total Bilirubin: 2.3 mg/dL — ABNORMAL HIGH (ref 0.2–1.2)
Total Protein: 6.8 g/dL (ref 6.1–8.1)

## 2020-09-13 LAB — CBC
HCT: 38.8 % (ref 35.0–45.0)
Hemoglobin: 13.3 g/dL (ref 11.7–15.5)
MCH: 32.4 pg (ref 27.0–33.0)
MCHC: 34.3 g/dL (ref 32.0–36.0)
MCV: 94.6 fL (ref 80.0–100.0)
MPV: 9.8 fL (ref 7.5–12.5)
Platelets: 255 10*3/uL (ref 140–400)
RBC: 4.1 10*6/uL (ref 3.80–5.10)
WBC: 8 10*3/uL (ref 3.8–10.8)

## 2020-09-13 LAB — TSH: TSH: 1.04 mIU/L

## 2020-09-13 LAB — LIPID PANEL
Cholesterol: 169 mg/dL (ref <200)
HDL: 70 mg/dL (ref >=50)
LDL Cholesterol (Calc): 86 mg/dL (calc)
Non-HDL Cholesterol (Calc): 99 mg/dL (calc) (ref <130)
Total CHOL/HDL Ratio: 2.4 (calc) (ref <5.0)
Triglycerides: 52 mg/dL (ref <150)

## 2020-09-15 NOTE — Addendum Note (Signed)
Addended byChristen Butter on: 09/15/2020 07:33 AM   Modules accepted: Orders

## 2020-10-09 NOTE — Progress Notes (Signed)
Subjective:    CC: Mood follow-up  HPI: Pleasant 35 year old female presenting today for follow-up on her mood.  Notes that she is doing better overall but is still not as energetic or as happy as she would like to be.  She did try a couple of over-the-counter herbal supplements but unfortunately was not able to tolerate those.  After taking the dose, she developed a headache with in 1 hour that lingered all day.  She did try taking them at night but did not see much benefit.  She was able to secure counseling but this will not start to the end of February.  She is working on eating healthy and has incorporated more Mediterranean diet type foods.  She is exercising regularly, running 20+ miles per week and doing regular weight training.  She is having difficulty sleeping and notes this has worsened.  She has not tried anything over-the-counter.  Denies SI/HI.  I reviewed the past medical history, family history, social history, surgical history, and allergies today and no changes were needed.  Please see the problem list section below in epic for further details.  Past Medical History: No past medical history on file. Past Surgical History: Past Surgical History:  Procedure Laterality Date  . WISDOM TOOTH EXTRACTION     Social History: Social History   Socioeconomic History  . Marital status: Married    Spouse name: Not on file  . Number of children: Not on file  . Years of education: Not on file  . Highest education level: Not on file  Occupational History  . Occupation: dietician  Tobacco Use  . Smoking status: Former Smoker    Packs/day: 0.25    Years: 5.00    Pack years: 1.25    Types: Cigarettes    Quit date: 07/12/2011    Years since quitting: 9.2  . Smokeless tobacco: Never Used  Vaping Use  . Vaping Use: Never used  Substance and Sexual Activity  . Alcohol use: Yes    Comment: 3  . Drug use: No  . Sexual activity: Yes    Partners: Male    Birth control/protection:  I.U.D.  Other Topics Concern  . Not on file  Social History Narrative  . Not on file   Social Determinants of Health   Financial Resource Strain: Not on file  Food Insecurity: Not on file  Transportation Needs: Not on file  Physical Activity: Not on file  Stress: Not on file  Social Connections: Not on file   Family History: Family History  Problem Relation Age of Onset  . Mitral valve prolapse Mother   . Depression Mother   . Depression Father   . Asthma Father   . Chronic bronchitis Father   . Asthma Sister   . Depression Sister        Post partum  . Cancer - Lung Paternal Grandfather    Allergies: No Known Allergies Medications: See med rec.  Review of Systems: See HPI for pertinent positives and negatives.   Objective:    General: Well Developed, well nourished, and in no acute distress.  Neuro: Alert and oriented x3, extra-ocular muscles intact, sensation grossly intact.  HEENT: Normocephalic, atraumatic, pupils equal round reactive to light, neck supple, no masses, no lymphadenopathy, thyroid nonpalpable.  Skin: Warm and dry, no rashes. Cardiac: Regular rate and rhythm, no murmurs rubs or gallops, no lower extremity edema.  Respiratory: Clear to auscultation bilaterally. Not using accessory muscles, speaking in full sentences.  Impression and Recommendations:    1. Depression with anxiety/panic disorder Plan to attend counseling as scheduled.  Discussed possible etiologies.  She is already doing lifestyle modifications for healthy living.  Since she was unable to tolerate herbal supplements, I discussed adding in prescription medication.  Since her biggest complaints are fatigue and feeling down, discussed Wellbutrin.  She would like to consider this but would like me to go ahead and send it in so I have sent Wellbutrin 75 mg tablets to take once daily in the morning.  If she is skittish to start with a full tablet, she can break the tablet in half and see if  this is helpful.  Recommend melatonin for sleeping.  2. Elevated bilirubin Her elevated bilirubin was likely related to strenuous exercise but we will go ahead and recheck this today.  Patient reports she did not do strenuous exercise this morning. - COMPLETE METABOLIC PANEL WITH GFR  No follow-ups on file. ___________________________________________ Thayer Ohm, DNP, APRN, FNP-BC Primary Care and Sports Medicine [facility] [place]

## 2020-10-10 ENCOUNTER — Encounter: Payer: Self-pay | Admitting: Certified Nurse Midwife

## 2020-10-10 ENCOUNTER — Ambulatory Visit: Payer: PRIVATE HEALTH INSURANCE | Admitting: Certified Nurse Midwife

## 2020-10-10 ENCOUNTER — Ambulatory Visit (INDEPENDENT_AMBULATORY_CARE_PROVIDER_SITE_OTHER): Payer: PRIVATE HEALTH INSURANCE | Admitting: Medical-Surgical

## 2020-10-10 ENCOUNTER — Other Ambulatory Visit (HOSPITAL_COMMUNITY)
Admission: RE | Admit: 2020-10-10 | Discharge: 2020-10-10 | Disposition: A | Discharge status: Home / Self Care | Payer: PRIVATE HEALTH INSURANCE | Source: Ambulatory Visit | Attending: Certified Nurse Midwife | Admitting: Certified Nurse Midwife

## 2020-10-10 ENCOUNTER — Other Ambulatory Visit: Payer: Self-pay

## 2020-10-10 ENCOUNTER — Encounter: Payer: Self-pay | Admitting: Medical-Surgical

## 2020-10-10 VITALS — BP 112/79 | HR 74 | Ht 68.0 in | Wt 133.0 lb

## 2020-10-10 DIAGNOSIS — F410 Panic disorder [episodic paroxysmal anxiety] without agoraphobia: Secondary | ICD-10-CM | POA: Diagnosis not present

## 2020-10-10 DIAGNOSIS — F418 Other specified anxiety disorders: Secondary | ICD-10-CM

## 2020-10-10 DIAGNOSIS — N926 Irregular menstruation, unspecified: Secondary | ICD-10-CM | POA: Diagnosis not present

## 2020-10-10 DIAGNOSIS — Z113 Encounter for screening for infections with a predominantly sexual mode of transmission: Secondary | ICD-10-CM | POA: Diagnosis not present

## 2020-10-10 DIAGNOSIS — Z975 Presence of (intrauterine) contraceptive device: Secondary | ICD-10-CM

## 2020-10-10 DIAGNOSIS — R17 Unspecified jaundice: Secondary | ICD-10-CM

## 2020-10-10 MED ORDER — BUPROPION HCL 75 MG PO TABS: 75.0000 mg | ORAL_TABLET | Freq: Every day | ORAL | 1 refills | Status: DC

## 2020-10-10 NOTE — Progress Notes (Signed)
Pt c/o irregular bleeding with IUD since November Pt has taken multiple negative UPTs IUD not due to come out until November 2024- pt wants to keep IUD if it is in correct position

## 2020-10-10 NOTE — Progress Notes (Addendum)
   GYNECOLOGY OFFICE VISIT NOTE  History:  35 y.o. G1P1001 here for irregular VB. She reports spotting and 1 day of menstrual bleeding each month for the last 3. Associated sx are cramping. She has only had spotting since IUD was placed. She can feel her strings. No new partner. She denies any abnormal vaginal discharge, pelvic pain or other concerns. She has taken multiple HPT, all were negative. She had Mirena IUD placed in 05/2016. She had Covid vaccine in Feb 2021.  No past medical history on file.  Past Surgical History:  Procedure Laterality Date  . WISDOM TOOTH EXTRACTION      The following portions of the patient's history were reviewed and updated as appropriate: allergies, current medications, past family history, past medical history, past social history, past surgical history and problem list.   Health Maintenance:  Normal pap and negative HRHPV on 07/16/16.    Review of Systems:  Negative except noted in HPI Genito-Urinary ROS: no dysuria, trouble voiding, or hematuria positive for - change in menstrual cycle   Objective:  Physical Exam BP 112/79   Pulse 74   Ht 5\' 8"  (1.727 m)   Wt 133 lb (60.3 kg)   BMI 20.22 kg/m  CONSTITUTIONAL: Well-developed, well-nourished female in no acute distress.  HENT:  Normocephalic, atraumatic EYES: Conjunctivae and EOM are normal NECK: Normal range of motion SKIN: Skin is warm and dry NEUROLOGIC: Alert and oriented to person, place, and time PSYCHIATRIC: Normal mood and affect CARDIOVASCULAR: Normal heart rate noted RESPIRATORY: Effort and rate normal ABDOMEN: Soft, no distention  PELVIC: Normal appearing external genitalia; normal appearing vaginal mucosa and cervix.  No abnormal discharge noted.  Normal uterine size, no other palpable masses, no uterine or adnexal tenderness. IUD strings seen MUSCULOSKELETAL: Normal range of motion  Labs and Imaging No results found for this or any previous visit (from the past 24  hour(s)).  Assessment & Plan:   1. Irregular bleeding   Transvaginal in 1 week Aptima swab  Follow up for annual exam and pap   Total face-to-face time with patient: 15 minutes   [place], Donette Larry 10/10/2020 9:31 AM

## 2020-10-11 LAB — COMPLETE METABOLIC PANEL WITH GFR
AG Ratio: 2.3 (calc) (ref 1.0–2.5)
ALT: 22 U/L (ref 6–29)
AST: 20 U/L (ref 10–30)
Albumin: 4.8 g/dL (ref 3.6–5.1)
Alkaline phosphatase (APISO): 26 U/L — ABNORMAL LOW (ref 31–125)
BUN: 15 mg/dL (ref 7–25)
CO2: 27 mmol/L (ref 20–32)
Calcium: 9.6 mg/dL (ref 8.6–10.2)
Chloride: 105 mmol/L (ref 98–110)
Creat: 0.68 mg/dL (ref 0.50–1.10)
GFR, Est African American: 131 mL/min/{1.73_m2} (ref >=60)
GFR, Est Non African American: 113 mL/min/{1.73_m2} (ref >=60)
Globulin: 2.1 g/dL (calc) (ref 1.9–3.7)
Glucose, Bld: 96 mg/dL (ref 65–99)
Potassium: 4.4 mmol/L (ref 3.5–5.3)
Sodium: 140 mmol/L (ref 135–146)
Total Bilirubin: 1.8 mg/dL — ABNORMAL HIGH (ref 0.2–1.2)
Total Protein: 6.9 g/dL (ref 6.1–8.1)

## 2020-10-12 LAB — CERVICOVAGINAL ANCILLARY ONLY
Bacterial Vaginitis (gardnerella): NEGATIVE
Candida Glabrata: NEGATIVE
Candida Vaginitis: NEGATIVE
Chlamydia: NEGATIVE
Comment: NEGATIVE
Comment: NEGATIVE
Comment: NEGATIVE
Comment: NEGATIVE
Comment: NEGATIVE
Comment: NORMAL
Neisseria Gonorrhea: NEGATIVE
Trichomonas: NEGATIVE

## 2020-10-13 ENCOUNTER — Other Ambulatory Visit: Payer: Self-pay

## 2020-10-13 ENCOUNTER — Ambulatory Visit (INDEPENDENT_AMBULATORY_CARE_PROVIDER_SITE_OTHER): Payer: PRIVATE HEALTH INSURANCE

## 2020-10-13 DIAGNOSIS — N926 Irregular menstruation, unspecified: Secondary | ICD-10-CM | POA: Diagnosis not present

## 2020-10-13 DIAGNOSIS — Z975 Presence of (intrauterine) contraceptive device: Secondary | ICD-10-CM

## 2020-10-27 ENCOUNTER — Ambulatory Visit (INDEPENDENT_AMBULATORY_CARE_PROVIDER_SITE_OTHER): Payer: PRIVATE HEALTH INSURANCE | Admitting: Licensed Clinical Social Worker

## 2020-10-27 DIAGNOSIS — F411 Generalized anxiety disorder: Secondary | ICD-10-CM | POA: Diagnosis not present

## 2020-10-27 DIAGNOSIS — F331 Major depressive disorder, recurrent, moderate: Secondary | ICD-10-CM

## 2020-10-27 DIAGNOSIS — F4010 Social phobia, unspecified: Secondary | ICD-10-CM | POA: Diagnosis not present
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
## 2020-11-07 ENCOUNTER — Encounter: Payer: Self-pay | Admitting: Medical-Surgical

## 2020-11-07 ENCOUNTER — Telehealth: Payer: PRIVATE HEALTH INSURANCE | Admitting: Medical-Surgical

## 2020-11-07 DIAGNOSIS — F418 Other specified anxiety disorders: Secondary | ICD-10-CM

## 2020-11-07 DIAGNOSIS — F410 Panic disorder [episodic paroxysmal anxiety] without agoraphobia: Secondary | ICD-10-CM | POA: Diagnosis not present

## 2020-11-07 MED ORDER — BUPROPION HCL 75 MG PO TABS: 37.5000 mg | ORAL_TABLET | Freq: Every day | ORAL | 0 refills | Status: DC

## 2020-11-07 NOTE — Progress Notes (Signed)
Virtual Visit via Video Note  I connected with Meghan Delacruz on 11/07/20 at  9:10 AM EST by a video enabled telemedicine application and verified that I am speaking with the correct person using two identifiers.   I discussed the limitations of evaluation and management by telemedicine and the availability of in person appointments. The patient expressed understanding and agreed to proceed.  Patient location: home Provider locations: office  Subjective:    CC: Mood follow-up  HPI: Pleasant 35 year old female presenting today for follow up on mood concerns. She has been taking the Wellbutrin daily at the 37.5mg  dose. She tried the 75mg  dose but did not tolerate due to palpitations, severe anxiety/panic. On the half dose, she feels that she is doing better, not as irritable or tired. Notes that the medication takes the edge off of her anxiety. Has had some improvement in sleep but does still have some trouble with sleeping.  States she read a chapter in her book prior to going to bed, she does sleep pretty well.  Denies SI/HI.  Past medical history, Surgical history, Family history not pertinant except as noted below, Social history, Allergies, and medications have been entered into the medical record, reviewed, and corrections made.   Review of Systems: See HPI for pertinent positives and negatives.  Depression screen [facility] 2/9 11/07/2020 10/10/2020 09/12/2020  Decreased Interest 0 1 1  Down, Depressed, Hopeless 1 1 1   PHQ - 2 Score 1 2 2   Altered sleeping 1 1 1   Tired, decreased energy 0 2 1  Change in appetite 0 1 1  Feeling bad or failure about yourself  1 2 2   Trouble concentrating 1 - 1  Moving slowly or fidgety/restless 0 0 0  Suicidal thoughts 0 0 0  PHQ-9 Score 4 8 8   Difficult doing work/chores Not difficult at all Somewhat difficult Somewhat difficult  Some encounter information is confidential and restricted. Go to Review Flowsheets activity to see all data.   GAD 7 : Generalized  Anxiety Score 11/07/2020 10/10/2020 09/12/2020  Nervous, Anxious, on Edge 1 1 1   Control/stop worrying 0 1 1  Worry too much - different things 1 1 1   Trouble relaxing 3 1 1   Restless 3 1 0  Easily annoyed or irritable 0 1 1  Afraid - awful might happen 0 0 0  Total GAD 7 Score 8 6 5   Anxiety Difficulty Not difficult at all Somewhat difficult Somewhat difficult   Objective:    General: Speaking clearly in complete sentences without any shortness of breath.  Alert and oriented x3.  Normal judgment. No apparent acute distress.  Impression and Recommendations:    1. Depression with anxiety/panic disorder Improvement in symptoms noted with Wellbutrin 37.5 mg daily still plan to continue this dose for at least 6 months.  After that, may consider a slow taper off if desired.  I discussed the assessment and treatment plan with the patient. The patient was provided an opportunity to ask questions and all were answered. The patient agreed with the plan and demonstrated an understanding of the instructions.   The patient was advised to call back or seek an in-person evaluation if the symptoms worsen or if the condition fails to improve as anticipated.  20 minutes of non-face-to-face time was provided during this encounter.  Return in about 6 months (around 05/10/2021) for mood follow up.  , DNP, APRN, FNP-BC [facility]

## 2020-11-10 ENCOUNTER — Ambulatory Visit: Payer: PRIVATE HEALTH INSURANCE | Admitting: Medical-Surgical

## 2020-11-28 ENCOUNTER — Ambulatory Visit (INDEPENDENT_AMBULATORY_CARE_PROVIDER_SITE_OTHER): Payer: PRIVATE HEALTH INSURANCE | Admitting: Licensed Clinical Social Worker

## 2020-11-28 DIAGNOSIS — F411 Generalized anxiety disorder: Secondary | ICD-10-CM | POA: Diagnosis not present

## 2020-11-28 DIAGNOSIS — F331 Major depressive disorder, recurrent, moderate: Secondary | ICD-10-CM

## 2020-11-28 DIAGNOSIS — F4010 Social phobia, unspecified: Secondary | ICD-10-CM

## 2020-11-28 NOTE — Progress Notes (Signed)
Virtual Visit via Video Note  I connected with Meghan Delacruz on 11/29/20 at  8:00 AM EDT by a video enabled telemedicine application and verified that I am speaking with the correct person using two identifiers.  Location: Patient: home Provider: home office   I discussed the limitations of evaluation and management by telemedicine and the availability of in person appointments. The patient expressed understanding and agreed to proceed.  I discussed the assessment and treatment plan with the patient. The patient was provided an opportunity to ask questions and all were answered. The patient agreed with the plan and demonstrated an understanding of the instructions.   The patient was advised to call back or seek an in-person evaluation if the symptoms worsen or if the condition fails to improve as anticipated.  I provided 54 minutes of non-face-to-face time during this encounter.  THERAPIST PROGRESS NOTE  Session Time: 8:00 AM to 8:54 AM  Participation Level: Active  Behavioral Response: CasualAlertAnxious  Type of Therapy: Individual Therapy  Treatment Goals addressed:  Self-esteem, body image issues, anxiety, emotional regulation, self-acceptance, coping Interventions: CBT, Solution Focused, Strength-based, Supportive and Other: emotional regulation, coping  Summary: Meghan Delacruz is a 35 y.o. female who presents with feeling like "week of Mondays" but not  too bad. A couple of stressors, husband something going on at work, change for anyone with anxiety is a trigger and anxiety provoking still pretty level. May be a raise and move. It would involve a move to another state. It is the unknown. Exciting but nerve wracking. Can't say mastered but she has been irritable to a lesser degree. Body image smart brain know in shape but look in the mirror and see where things could be better. Since junior high. Wanted to dye hair and feedback from family was to lose 20 pounds. Negative  messaging from family that no good enough. Patient only one who took the fitness nutrition route, now too skinny never acceptance. They are unhealthy. Everything proportioned as far as meals and what she puts on the grocery list and does have a cheat day. When younger more the anorexia and control. Now the problem is snaking at night. Not extreme cereal,   Granola bars, over eat then exercise. Mostly eating issues under control. Needs to work on controlling emotions do ok with the rest when feeling ok don't get urges only when super stressed, overwhelmed. Out of control what put body can control why people have eating issues. Works in Scientific laboratory technician. Cutting medication in half full pill makes symptoms worse.  In reviewing treatment plan patient shared needing to work on body image issues, and reviewing CBT patient endorsed physical symptoms of fast heartbeat, body hot and then sweats, short of breath if it is going on a while constipation did not realize how much it affected her body.  Shared with anxiety do not like to be out in crowded places, hard to react with people she does not know noted when younger she would not go out to places like restaurants, shares in large crowds there is a lot of sensory input and too many people, so interacting with new people and new places can be anxiety provoking. Suicidal/Homicidal: No  Therapist Response: Therapist reviewed symptoms, facilitated expression of thoughts and feelings and completed treatment plan.  Patient gave consent to complete virtually.  Noted patient wanting to work on emotional regulation skills and provided positive feedback for wanting to work on communication because communicating how she feels is getting help  her contain her feelings a way of regulating her emotions, communicating his insight to herself and others what is going on so others can help [place] as well as more insight of coping skills we want to use when we identify what the issue  is.  Noted pausing before reacting a significant it slows the emotional brain down the a rational brain has time to take over and allows [place] to choose better options when we do not just react.  Introduced Fish farm manager of self acceptance is key to self-esteem working on that in therapy along with pathways to strengthen self-esteem.  Note patient's body image issues so working on self acceptance as well as challenging distortions and perceptions about body image will be helpful.  Introduced CBT how anxiety is a false alarm that often it is a misperception of dangerousness.  Noted behaviors patient does to keep it going providing education on why that happens will be helpful and patient being able to work on changing some behaviors.  The avoidance and escape never allows [place] to see cyst situation was not as bad as we thought and we never develop coping skills to handle it.  Also introduced how adrenaline goes through the body and why we have the rapid heartbeat and other bodily symptoms.  This provided active listening, open questions, supportive interventions.  Work with patient on concept of unknown that anxiety is created by not knowing was can happen but coming to realize that there is little we can control and build into life and acceptance that things are out of her control but also the attitude that things will be all right there is low risk involved with the unknown.  Plan: Return again in 7 weeks.2.  Finish CBT [place] on anxiety, work on self-esteem and [employer] is continue to work on anxiety look at automatic thoughts, review STOPP  Diagnosis: Axis I:  major depressive disorder, recurrent, moderate, generalized anxiety disorder, social anxiety    Axis II: No diagnosis    Coolidge Breeze, [place] 11/28/2020

## 2021-01-02 ENCOUNTER — Ambulatory Visit (HOSPITAL_COMMUNITY): Payer: PRIVATE HEALTH INSURANCE | Admitting: Licensed Clinical Social Worker

## 2021-01-16 ENCOUNTER — Ambulatory Visit (HOSPITAL_COMMUNITY): Payer: Self-pay | Admitting: Licensed Clinical Social Worker

## 2021-01-30 ENCOUNTER — Ambulatory Visit (HOSPITAL_COMMUNITY): Payer: PRIVATE HEALTH INSURANCE | Admitting: Licensed Clinical Social Worker

## 2021-02-13 ENCOUNTER — Ambulatory Visit (INDEPENDENT_AMBULATORY_CARE_PROVIDER_SITE_OTHER): Payer: PRIVATE HEALTH INSURANCE | Admitting: Licensed Clinical Social Worker

## 2021-02-13 DIAGNOSIS — F411 Generalized anxiety disorder: Secondary | ICD-10-CM

## 2021-02-13 DIAGNOSIS — F331 Major depressive disorder, recurrent, moderate: Secondary | ICD-10-CM

## 2021-02-13 DIAGNOSIS — F4010 Social phobia, unspecified: Secondary | ICD-10-CM | POA: Diagnosis not present

## 2021-02-13 NOTE — Progress Notes (Signed)
Virtual Visit via Telephone Note  I connected with Meghan Delacruz on 02/13/21 at 10:00 AM EDT by telephone and verified that I am speaking with the correct person using two identifiers.  Location: Patient: home Provider: home office   I discussed the limitations, risks, security and privacy concerns of performing an evaluation and management service by telephone and the availability of in person appointments. I also discussed with the patient that there may be a patient responsible charge related to this service. The patient expressed understanding and agreed to proceed.    I discussed the assessment and treatment plan with the patient. The patient was provided an opportunity to ask questions and all were answered. The patient agreed with the plan and demonstrated an understanding of the instructions.   The patient was advised to call back or seek an in-person evaluation if the symptoms worsen or if the condition fails to improve as anticipated.  I provided 50 minutes of non-face-to-face time during this encounter.  THERAPIST PROGRESS NOTE  Session Time: 10:00 AM to 10:50 AM  Participation Level: Active  Behavioral Response: CasualAlertAnxious, Depressed, and Irritable  Type of Therapy: Individual Therapy  Treatment Goals addressed:  Self-esteem, body image issues, anxiety, emotional regulation, self-acceptance, coping Interventions: CBT, Solution Focused, Strength-based, Supportive, Reframing, and Other: coping   Summary: Meghan Delacruz is a 35 y.o. female who presents with confirmation that her husband is going to take a job in [place] starting in August or September big change and has to figure all that out. Son doesn't know and don't know how to tell him the news. Not planning to move down right away. Will stay with him to finish the school year. Worried about son missing him but husband does go out of town for his job and doing ok. Anxious about moving to [place] crowded, not  good schools. Big move making her anxiety worse, contributing to emotional eating and self-esteem issues. Maintained on vacation, worked on there and that caused pain in back to knee. Going through things with husband. Gained 7 lbs lost half of it. May be hormonal. Track weight and goes up and down. So frustrating that can't get emotional regulation in check contributes to the fights with husband. Thinking of couple's counseling. Significant factor is she is staying in her job so have loan forgiveness.  Noted she does not know yet about [place] and could be positive such as finding private school, finding a nice neighborhood.  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 Son good at those things. Can Start applying for jobs for herself until January. Knows she can do it but overwhelming can do it. This has been a big source of husband and her fighting, problems already. Husband gets frustrated and tells her you don't know what is going to happen, big opportunity, be happy. Liked one neighborhood. Cost is a lot higher. Doesn't like how he got the job feels "[place]" state of the economy afraid it is going to be a financially hard for them. Giving up saved vacation time and personal time. If stay where is could retire in 30 years. At first wanted to go but now likes her work, medication helping.  I can have her Wellbutrin 37.5 mg. Less than a month ago gave him the offer and he accepted. Couldn't say no now somebody already has his job. Moved from [place] to [place] before being a Mom and that was fine. Doesn't  know how to get past it to enjoy it. When husband says to her let go and calm down don't like when he says that. Gets wrapped up in thoughts and go down rabbit hole. Doesn't know where to start with them genetically predisposed to anxiety.  Therapist reviewed some strategies for DBT including distress tolerance and mindfulness  patient thinks that will be helpful.  Therapist does not have enough openings for patient to do regular therapy when she needs Friday so given resources for her to look for a therapist that specializes in DBT  Therapist reviewed symptoms, facilitated expression of thoughts and feelings utilize reframing for patient to see positive aspects of moving to [place] also challenged her on negative forecasting pointing out she has no idea what the future is going to be so thoughts are created in her head that have no basis and fax.  Also cognitive distortion of catastrophizing.  Noted she has a year to adjust which will be helpful.  Noticed thoughts are not always telling the truth or not always her friends they are neurochemical signals so handled them more lately look at them and challenge them for accuracy.Reviewed website for cognitive behavioral therapy for eating disorders.  Noted that CBT is the most commonly utilized and evidence-based treatment for most mental health disorders including eating disorders.  The foundation of this treatment is the belief that mental and emotional distress is caused by negative, maladaptive and unhelpful thoughts about oneself others and circumstances in the environment around them.  CBT deposits that once thoughts lead to emotions and motions lead to behaviors such CBT therapy involves altering once core beliefs about thoughts in order to then alter emotions and behavior.  Reviewed other strategies such as ACT.  Reviewed DBT learning distress tolerance skills, mindfulness skills, emotional regulation skills noted mindfulness with help patient with being more accepting and not as judgmental about the present moment, distress tolerance helping her to manage to stressful situation and not choosing to act in ways that make the situation worse.  Emotional regulation the ability to use necessary skills in the moment to regulate emotions appropriately and effectively.  Patient thinks this  type of therapy helpful therapist does not have appointment soon to continue on a regular basis so patient was given resource of psychology today to look for DBT therapist.  Therapist provided active listening, open questions, supportive interventions Suicidal/Homicidal: No  Plan: 1.  Patient will look for therapist to have evening hours more openings given resources psychology today so she will not continue with this therapist due to limited availability Diagnosis: Axis I: major depressive disorder, recurrent, moderate, generalized anxiety disorder, social anxiet    Axis II: No diagnosis    Coolidge Breeze, LCSW 02/13/2021

## 2021-02-20 ENCOUNTER — Encounter: Payer: Self-pay | Admitting: Medical-Surgical

## 2021-02-20 DIAGNOSIS — F418 Other specified anxiety disorders: Secondary | ICD-10-CM

## 2021-02-27 ENCOUNTER — Ambulatory Visit (HOSPITAL_COMMUNITY): Payer: Self-pay | Admitting: Licensed Clinical Social Worker

## 2021-04-02 ENCOUNTER — Telehealth (INDEPENDENT_AMBULATORY_CARE_PROVIDER_SITE_OTHER): Payer: PRIVATE HEALTH INSURANCE | Admitting: Medical-Surgical

## 2021-04-02 ENCOUNTER — Encounter: Payer: Self-pay | Admitting: Medical-Surgical

## 2021-04-02 VITALS — Wt 146.0 lb

## 2021-04-02 DIAGNOSIS — F410 Panic disorder [episodic paroxysmal anxiety] without agoraphobia: Secondary | ICD-10-CM | POA: Diagnosis not present

## 2021-04-02 DIAGNOSIS — F418 Other specified anxiety disorders: Secondary | ICD-10-CM | POA: Diagnosis not present

## 2021-04-02 MED ORDER — SERTRALINE HCL 25 MG PO TABS: ORAL_TABLET | ORAL | 0 refills | Status: DC

## 2021-04-02 NOTE — Progress Notes (Signed)
Feels like Wellbutrin wears off during the day Feels like she's under a lot of stress

## 2021-04-02 NOTE — Progress Notes (Signed)
Virtual Visit via Video Note  I connected with Meghan Delacruz on 04/02/21 at 11:10 AM EDT by a video enabled telemedicine application and verified that I am speaking with the correct person using two identifiers.   I discussed the limitations of evaluation and management by telemedicine and the availability of in person appointments. The patient expressed understanding and agreed to proceed.  Patient location: home Provider locations: office  Subjective:    CC: Mood follow-up  HPI: Pleasant 35 year old female presenting via MyChart video visit for mood follow-up.  She has been taking Wellbutrin 75 mg daily, tolerating well without side effects.  Notes that she does have an increase in irritability and mood issues in the afternoons and she feels like the medication wears off early in the day.  She started taking it at 8 AM in the morning which is helped a little but sometimes she forgets to take it until around lunchtime.  She does not feel like the medication is doing much to help her overall mood and anxiety.  She does have quite a bit of situational issues that are causing increased stress.  Notes that her husband is moving out of town and will be gone long-term while she and her son stay here for the school year.  They are doing family counseling for her son but unfortunately she has not been able to connect with a counselor for herself.  We did do a couple of referrals and she did not connect with the first counselor very well and was unable to connect with a female counselor with our second referral.  She does have a EAP starting at her work and will be investigating the options there.  Previously resistant to the idea of starting a different medication outside the Wellbutrin but at this point, expresses desire to try what ever may help.  Denies SI/HI.  Past medical history, Surgical history, Family history not pertinant except as noted below, Social history, Allergies, and medications have been  entered into the medical record, reviewed, and corrections made.   Review of Systems: See HPI for pertinent positives and negatives.   Depression screen [facility] 2/9 04/02/2021 11/07/2020 10/10/2020 09/12/2020  Decreased Interest 1 0 1 1  Down, Depressed, Hopeless 1 1 1 1   PHQ - 2 Score 2 1 2 2   Altered sleeping 2 1 1 1   Tired, decreased energy 1 0 2 1  Change in appetite 1 0 1 1  Feeling bad or failure about yourself  3 1 2 2   Trouble concentrating 1 1 - 1  Moving slowly or fidgety/restless 1 0 0 0  Suicidal thoughts 0 0 0 0  PHQ-9 Score 11 4 8 8   Difficult doing work/chores Somewhat difficult Not difficult at all Somewhat difficult Somewhat difficult  Some encounter information is confidential and restricted. Go to Review Flowsheets activity to see all data.   GAD 7 : Generalized Anxiety Score 04/02/2021 11/07/2020 10/10/2020 09/12/2020  Nervous, Anxious, on Edge 3 1 1 1   Control/stop worrying 3 0 1 1  Worry too much - different things 3 1 1 1   Trouble relaxing 3 3 1 1   Restless 2 3 1  0  Easily annoyed or irritable 2 0 1 1  Afraid - awful might happen 1 0 0 0  Total GAD 7 Score 17 8 6 5   Anxiety Difficulty Somewhat difficult Not difficult at all Somewhat difficult Somewhat difficult   Objective:    General: Speaking clearly in complete sentences without any shortness  of breath.  Alert and oriented x3.  Normal judgment. No apparent acute distress.  Impression and Recommendations:    1. Depression with anxiety 2. Panic disorder Do not feel Wellbutrin is an appropriate treatment for her given her current symptoms.  Discontinue Wellbutrin with a slow taper over the next week.  Start sertraline 25 mg daily for 1 week then increase to 50 mg daily.  Continue with plan for discussion with EAP regarding counseling resources.  I discussed the assessment and treatment plan with the patient. The patient was provided an opportunity to ask questions and all were answered. The patient agreed with the plan  and demonstrated an understanding of the instructions.   The patient was advised to call back or seek an in-person evaluation if the symptoms worsen or if the condition fails to improve as anticipated.  20 minutes of non-face-to-face time was provided during this encounter.  Return in about 4 weeks (around 04/30/2021) for mood follow up.  Thayer Ohm, DNP, APRN, FNP-BC [facility]

## 2021-04-28 ENCOUNTER — Encounter: Payer: Self-pay | Admitting: Medical-Surgical

## 2021-06-05 ENCOUNTER — Ambulatory Visit: Payer: PRIVATE HEALTH INSURANCE | Admitting: Certified Nurse Midwife

## 2021-06-25 ENCOUNTER — Ambulatory Visit: Payer: PRIVATE HEALTH INSURANCE | Admitting: Obstetrics & Gynecology

## 2021-06-25 ENCOUNTER — Other Ambulatory Visit (HOSPITAL_COMMUNITY)
Admission: RE | Admit: 2021-06-25 | Discharge: 2021-06-25 | Disposition: A | Discharge status: Home / Self Care | Payer: PRIVATE HEALTH INSURANCE | Source: Ambulatory Visit | Attending: Obstetrics & Gynecology | Admitting: Obstetrics & Gynecology

## 2021-06-25 ENCOUNTER — Encounter: Payer: Self-pay | Admitting: Obstetrics & Gynecology

## 2021-06-25 ENCOUNTER — Other Ambulatory Visit: Payer: Self-pay

## 2021-06-25 VITALS — BP 112/73 | HR 68 | Ht 68.0 in | Wt 125.0 lb

## 2021-06-25 DIAGNOSIS — R102 Pelvic and perineal pain: Secondary | ICD-10-CM | POA: Insufficient documentation

## 2021-06-25 DIAGNOSIS — Z124 Encounter for screening for malignant neoplasm of cervix: Secondary | ICD-10-CM | POA: Insufficient documentation

## 2021-06-25 DIAGNOSIS — Z975 Presence of (intrauterine) contraceptive device: Secondary | ICD-10-CM | POA: Diagnosis not present

## 2021-06-25 DIAGNOSIS — N921 Excessive and frequent menstruation with irregular cycle: Secondary | ICD-10-CM | POA: Diagnosis not present

## 2021-06-25 LAB — POCT URINE PREGNANCY: Preg Test, Ur: NEGATIVE

## 2021-06-25 NOTE — Patient Instructions (Signed)
Intrauterine Device Insertion An intrauterine device (IUD) is a medical device that is inserted into the uterus to prevent pregnancy. It is a small, T-shaped device that has one or two nylon strings hanging down from it. The strings hang out of the lower part of the uterus (cervix) to allow for future IUD removal. There are two types of IUDs: Hormone IUD. This type of IUD is made of plastic and contains the hormone progestin (synthetic progesterone). A hormone IUD may last 3-5 years, depending on which one you have. Synthetic progesterone prevents pregnancy by: Thickening cervical mucus to prevent sperm from entering the uterus. Thinning the uterine lining to prevent a fertilized egg from implanting there. Copper IUD. This type of IUD has copper wire wrapped around it. A copper IUD may last up to 10 years. Copper prevents pregnancy by making the uterus and fallopian tubes produce a fluid that kills sperm. Tell a health care provider about: Any allergies you have. All medicines you are taking, including vitamins, herbs, eye drops, creams, and over-the-counter medicines. Any surgeries you have had. Any medical conditions you have, including any sexually transmitted infections (STIs) you may have. Whether you are pregnant or may be pregnant. What are the risks? Generally, this is a safe procedure. However, problems may occur, including: Infection. Bleeding. Allergic reactions to medicines. Puncture (perforation) of the uterus or damage to other structures or organs. Accidental placement of the IUD either in the muscle layer of the uterus (myometrium) or outside the uterus. The IUD falling out of the uterus (expulsion). This is more common among women who have recently had a child. Higher risk of an egg being fertilized outside your uterus (ectopic pregnancy).This is rare. Pelvic inflammatory disease (PID), which is an infection in the uterus and fallopian tubes. The IUD does not cause the  infection. The infection is usually from an unknown sexually transmitted infection (STI). This is rare, and it usually happens during the first 20 days after the IUD is inserted. What happens before the procedure? Ask your health care provider about: Changing or stopping your regular medicines. This is especially important if you are taking diabetes medicines or blood thinners. Taking over-the-counter medicines, vitamins, herbs, and supplements. Talk with your health care provider about when to schedule your IUD placement. Your health care provider may recommend taking over-the-counter pain medicines before the procedure. These medicines include ibuprofen and naproxen. You may have tests for: Pregnancy. A pregnancy test involves having a urine or blood sample taken. Sexually transmitted infections (STIs). Placing an IUD in someone who has an STI can make the infection worse. Cervical cancer. You may have a Pap test to check for this type of cancer. This means collecting cells from your cervix to be checked under a microscope. You may have a physical exam to determine the size and position of your uterus. What happens during the procedure? A tool (speculum) will be placed in your vagina and widened so that your health care provider can see your cervix. Medicine, or antiseptic, may be applied to your cervix to help lower your risk of infection. You may be given an anesthetic medicine to numb each side of your cervix. This medicine is usually given by an injection into the cervix. A tool called a uterine sound will be inserted into your uterus to check the length of your uterus and the direction that your uterus may be tilted. A slim instrument or tube (IUD inserter) that holds the IUD will be inserted into your vagina,   through your cervical canal, and into your uterus. The IUD will be placed in the uterus, and the IUD inserter will be removed. The strings that are attached to the IUD will be trimmed  so that they lie just below the cervix. The speculum will be removed. The procedure may vary among health care providers and hospitals. What can I expect after procedure? You may have bleeding after the procedure. This is normal. It varies from light bleeding (spotting) for a few days to menstrual-like bleeding. You may have cramping and pain in the abdomen. You may feel dizzy or light-headed. You may have lower back pain. You may have headaches and nausea. Follow these instructions at home: Before resuming sexual activity, check to make sure that you can feel the IUD string or strings. You should be able to feel the end of the string below the opening of your cervix. If your IUD string is in place, you may resume sexual activity. If you had a hormonal IUD inserted more than 7 days after your most recent period started, you will need to use a backup method of birth control for 7 days after IUD insertion. Ask your health care provider whether this applies to you. Continue to check that the IUD is still in place by feeling for the strings after every menstrual period, or once a month. An IUD will not protect you from sexually transmitted infections (STIs). Use methods to prevent the exchange of body fluids between partners (barrier protection) every time you have sex. Barrier protection can be used during oral, vaginal, or anal sex. Commonly used barrier methods include: Female condom. Female condom. Dental dam. Take over-the-counter and prescription medicines only as told by your health care provider. Keep all follow-up visits. This is important. Contact a health care provider if: You feel light-headed or weak. You have any of the following problems with your IUD string or strings: The string bothers or hurts you or your sexual partner. You cannot feel the string. The string has gotten longer. You can feel the IUD in your vagina. You think you may be pregnant, or you miss your menstrual  period. You think you may have a sexually transmitted infection (STI). Get help right away if you: You have flu-like symptoms, such as tiredness (fatigue) and muscle aches. You have a fever and chills. You have bleeding that is heavier or lasts longer than a normal menstrual cycle. You have abnormal or bad-smelling discharge from your vagina. You develop abdominal pain that is new, is getting worse, or is not in the same area of earlier cramping and pain. You have pain during sexual activity. Summary An intrauterine device (IUD) is a small, T-shaped device that has one or two nylon strings hanging down from it. You may have a copper IUD or a hormone IUD. Ask your health care provider what you need to do before the procedure. You may have some tests and you may have to change or stop some medicines. You may have bleeding after the procedure. This is normal. It varies from light spotting for a few days to menstrual-like bleeding. Check to make sure that you can feel the IUD strings before you resume sexual activity. Check the strings after every menstrual period or once a month. An IUD does not protect against STIs. Use other methods to protect yourself against infections. This information is not intended to replace advice given to you by your health care provider. Make sure you discuss any questions you have with   your health care provider. Document Revised: 03/05/2020 Document Reviewed: 03/05/2020 Elsevier Patient Education  2022 Elsevier Inc.  

## 2021-06-25 NOTE — Progress Notes (Addendum)
GYNECOLOGY OFFICE VISIT NOTE  History:   Meghan Delacruz is a 35 y.o. G1P1001 here today for new onset left side pelvic pain and continued increased intermittent spotting with IUD. Has noticed more bleeding over the last year with the IUD, it will be five years next month since she had the IUD placed. Also increased left sided pain recently, she is worried about ovarian cyst.  Of note, she is under a lot of stress right now with separation from husband.  She denies any abnormal vaginal discharge, or other concerns.    History reviewed. No pertinent past medical history.  Past Surgical History:  Procedure Laterality Date   WISDOM TOOTH EXTRACTION      The following portions of the patient's history were reviewed and updated as appropriate: allergies, current medications, past family history, past medical history, past social history, past surgical history and problem list.   Health Maintenance:  Normal pap and negative HRHPV  in 2017.  Review of Systems:  Pertinent items noted in HPI and remainder of comprehensive ROS otherwise negative.  Physical Exam:  BP 112/73   Pulse 68   Ht 5\' 8"  (1.727 m)   Wt 125 lb (56.7 kg)   BMI 19.01 kg/m  CONSTITUTIONAL: Well-developed, well-nourished female in no acute distress.  HEENT:  Normocephalic, atraumatic. External right and left ear normal. No scleral icterus.  NECK: Normal range of motion, supple, no masses noted on observation SKIN: No rash noted. Not diaphoretic. No erythema. No pallor. MUSCULOSKELETAL: Normal range of motion. No edema noted. NEUROLOGIC: Alert and oriented to person, place, and time. Normal muscle tone coordination. No cranial nerve deficit noted. PSYCHIATRIC: Normal mood and affect. Normal behavior. Normal judgment and thought content. CARDIOVASCULAR: Normal heart rate noted RESPIRATORY: Effort and breath sounds normal, no problems with respiration noted ABDOMEN: No masses noted. No other overt distention noted.    PELVIC: Normal appearing external genitalia but with Grade 1 cystocele noted; normal urethral meatus; normal appearing vaginal mucosa and cervix. IUD strings seen, very short outside cervix.  Brown discharge noted, no active bleeding. Pap smear done.  Normal uterine size, no other palpable masses, no uterine tenderness but has significant left adnexal tenderness. Performed in the presence of a chaperone  Labs and Imaging Results for orders placed or performed in visit on 06/25/21 (from the past 168 hour(s))  POCT urine pregnancy   Collection Time: 06/25/21 10:34 AM  Result Value Ref Range   Preg Test, Ur Negative Negative   No results found.    Assessment and Plan:     1. Breakthrough bleeding associated with intrauterine device (IUD) 2. Pelvic pain in female Patient is not pregnant.  Increased bleeding could be due to waning hormonal level of the IUD, she is interested in replacing this soon so to get back to high levels for suppression of abnormal bleeding.  Also could explain her pain, may be ovarian cysts not suppressed by the IUD hormonal levels that are now decreased.  Will also check for vaginitis/cervicitis etiologies, will follow up results and manage accordingly. Offered NSAIDs for treatment, she declined, wants to return for replacement. - 06/27/21 PELVIC COMPLETE WITH TRANSVAGINAL; Future - Cervicovaginal ancillary only  3. Pap smear for cervical cancer screening - Cytology - PAP done today, will follow up results and manage accordingly.  Patient does report symptomatic bulge with her cystocele when running, but no urinary issues. Not desiring intervention for now.  Routine preventative health maintenance measures emphasized. Please refer to After Visit  Summary for other counseling recommendations.   Return in about 2 weeks (around 07/09/2021) for Mirena removal and reinsertion.    I spent 20 minutes dedicated to the care of this patient including pre-visit review of records, face  to face time with the patient discussing her conditions and treatments and post visit orders.    Jaynie Collins, MD, FACOG Obstetrician & Gynecologist, [facility] for [employer], [facility]

## 2021-06-26 LAB — CERVICOVAGINAL ANCILLARY ONLY
Bacterial Vaginitis (gardnerella): NEGATIVE
Candida Glabrata: NEGATIVE
Candida Vaginitis: NEGATIVE
Chlamydia: NEGATIVE
Comment: NEGATIVE
Comment: NEGATIVE
Comment: NEGATIVE
Comment: NEGATIVE
Comment: NEGATIVE
Comment: NORMAL
Neisseria Gonorrhea: NEGATIVE
Trichomonas: NEGATIVE

## 2021-06-28 LAB — CYTOLOGY - PAP
Comment: NEGATIVE
Diagnosis: NEGATIVE
Diagnosis: REACTIVE
High risk HPV: NEGATIVE

## 2021-06-29 ENCOUNTER — Ambulatory Visit (INDEPENDENT_AMBULATORY_CARE_PROVIDER_SITE_OTHER): Payer: PRIVATE HEALTH INSURANCE

## 2021-06-29 ENCOUNTER — Other Ambulatory Visit: Payer: Self-pay

## 2021-06-29 DIAGNOSIS — Z975 Presence of (intrauterine) contraceptive device: Secondary | ICD-10-CM | POA: Diagnosis not present

## 2021-06-29 DIAGNOSIS — N921 Excessive and frequent menstruation with irregular cycle: Secondary | ICD-10-CM | POA: Diagnosis not present

## 2021-06-29 DIAGNOSIS — R102 Pelvic and perineal pain: Secondary | ICD-10-CM

## 2021-06-30 ENCOUNTER — Other Ambulatory Visit: Payer: PRIVATE HEALTH INSURANCE

## 2021-07-13 ENCOUNTER — Ambulatory Visit: Payer: PRIVATE HEALTH INSURANCE | Admitting: Obstetrics & Gynecology

## 2021-07-14 ENCOUNTER — Other Ambulatory Visit: Payer: Self-pay

## 2021-07-14 ENCOUNTER — Encounter: Payer: Self-pay | Admitting: Obstetrics and Gynecology

## 2021-07-14 ENCOUNTER — Ambulatory Visit: Payer: No Typology Code available for payment source | Admitting: Obstetrics and Gynecology

## 2021-07-14 VITALS — BP 130/84 | HR 78 | Ht 68.0 in

## 2021-07-14 DIAGNOSIS — Z01812 Encounter for preprocedural laboratory examination: Secondary | ICD-10-CM

## 2021-07-14 DIAGNOSIS — Z30430 Encounter for insertion of intrauterine contraceptive device: Secondary | ICD-10-CM

## 2021-07-14 DIAGNOSIS — Z30433 Encounter for removal and reinsertion of intrauterine contraceptive device: Secondary | ICD-10-CM

## 2021-07-14 LAB — POCT URINE PREGNANCY: Preg Test, Ur: NEGATIVE

## 2021-07-14 MED ORDER — LEVONORGESTREL 20 MCG/DAY IU IUD
1.0000 | INTRAUTERINE_SYSTEM | Freq: Once | INTRAUTERINE | Status: AC
Start: 2021-07-14 — End: 2021-07-14
  Administered 2021-07-14: 1 via INTRAUTERINE

## 2021-07-14 NOTE — Progress Notes (Signed)
    GYNECOLOGY OFFICE PROCEDURE NOTE  Meghan Delacruz is a 35 y.o. G1P1001 here for Mirena IUD removal and reinsertion. No GYN concerns.  Last pap smear was on 06/2021 and was normal. 5 year removal for AUB.   IUD Removal and Reinsertion  Patient identified, informed consent performed, consent signed.   Discussed risks of irregular bleeding, cramping, infection, malpositioning or misplacement of the IUD outside the uterus which may require further procedures. Also discussed >99% contraception efficacy, increased risk of ectopic pregnancy with failure of method.   Emphasized that this did not protect against STIs, condoms recommended during all sexual encounters.Advised to use backup contraception for one week as the risk of pregnancy is higher during the transition period of removing an IUD and replacing it with another one. Time out was performed. Speculum placed in the vagina. The strings of the IUD were grasped and pulled using ring forceps. The IUD was successfully removed in its entirety. The cervix was cleaned with Betadine x 2 and grasped anteriorly with a single tooth tenaculum.  The new Mirena IUD insertion apparatus was used to sound the uterus to 7 cm;  the IUD was then placed per manufacturer's recommendations. Strings trimmed to 3 cm. Tenaculum was removed, good hemostasis noted. Patient tolerated procedure well.   Patient was given post-procedure instructions.  She was reminded to have backup contraception for one week during this transition period between IUDs.  Patient was also asked to check IUD strings periodically and follow up in 4 weeks for IUD check.  Meghan Delacruz, Harolyn Rutherford, NP [facility] [employer], [facility]

## 2021-08-10 ENCOUNTER — Ambulatory Visit: Payer: No Typology Code available for payment source | Admitting: Obstetrics & Gynecology

## 2021-08-18 ENCOUNTER — Other Ambulatory Visit: Payer: Self-pay

## 2021-08-18 ENCOUNTER — Encounter: Payer: Self-pay | Admitting: Advanced Practice Midwife

## 2021-08-18 ENCOUNTER — Ambulatory Visit: Payer: No Typology Code available for payment source | Admitting: Advanced Practice Midwife

## 2021-08-18 VITALS — BP 132/86 | HR 84 | Ht 68.0 in

## 2021-08-18 DIAGNOSIS — Z30431 Encounter for routine checking of intrauterine contraceptive device: Secondary | ICD-10-CM | POA: Diagnosis not present

## 2021-08-18 DIAGNOSIS — Z975 Presence of (intrauterine) contraceptive device: Secondary | ICD-10-CM | POA: Insufficient documentation

## 2021-08-18 NOTE — Progress Notes (Signed)
Pt refused weight No issues with IUD

## 2021-08-18 NOTE — Progress Notes (Signed)
° °  GYNECOLOGY CLINIC PROGRESS NOTE  History:  35 y.o. G1P1001 here at [facility] today for today for IUD string check; Mirena  IUD was placed  07/14/21. No complaints about the IUD, no concerning side effects.  The following portions of the patient's history were reviewed and updated as appropriate: allergies, current medications, past family history, past medical history, past social history, past surgical history and problem list. Last pap smear on 06/25/21 was normal, negative HRHPV.  Review of Systems:  Pertinent items are noted in HPI.   Objective:  Physical Exam Blood pressure 132/86, pulse 84, height 5\' 8"  (1.727 m), currently breastfeeding. Gen: NAD Abd: Soft, nontender and nondistended Pelvic: Normal appearing external genitalia; normal appearing vaginal mucosa and cervix.  IUD strings visualized, about 3 cm in length outside cervix.   Assessment & Plan:  Normal IUD check. Patient to keep IUD in place for five years; can come in for removal if she desires pregnancy within the next five years. Routine preventative health maintenance measures emphasized.  , CNM 1:31 PM

## 2022-09-27 ENCOUNTER — Encounter: Payer: No Typology Code available for payment source | Admitting: Medical-Surgical

## 2022-11-06 IMAGING — US US TRANSVAGINAL NON-OB
1 series · 13 of 25 positions shown · non-contrast
Comparison: None.

CLINICAL DATA: Initial evaluation for irregular vaginal bleeding,
IUD check.

EXAM:
ULTRASOUND PELVIS TRANSVAGINAL
TECHNIQUE: Transvaginal ultrasound examination of the pelvis was performed
including evaluation of the uterus, ovaries, adnexal regions, and
pelvic cul-de-sac.

[Series 1: us transvaginal non-ob · 60 acquisitions, 13 frames shown]
[im 1/60]
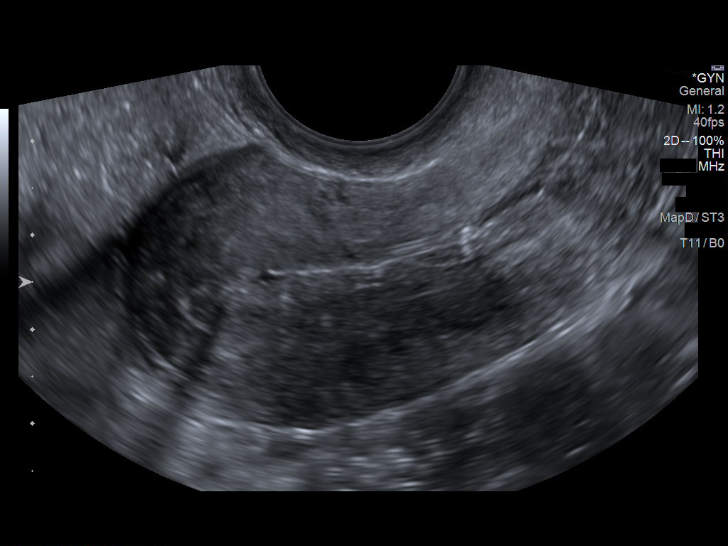
[im 5/60]
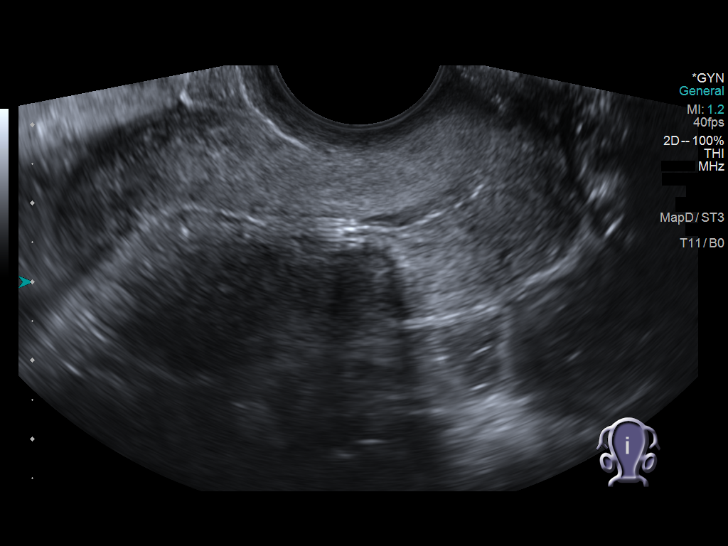
[im 10/60]
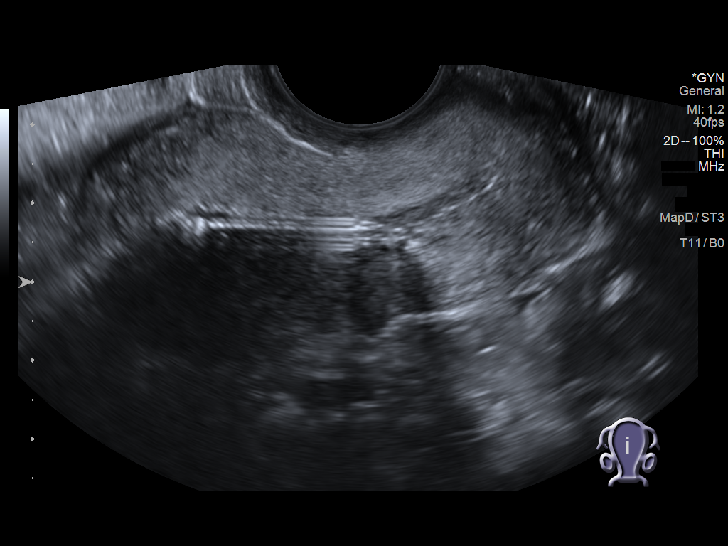
[im 15/60]
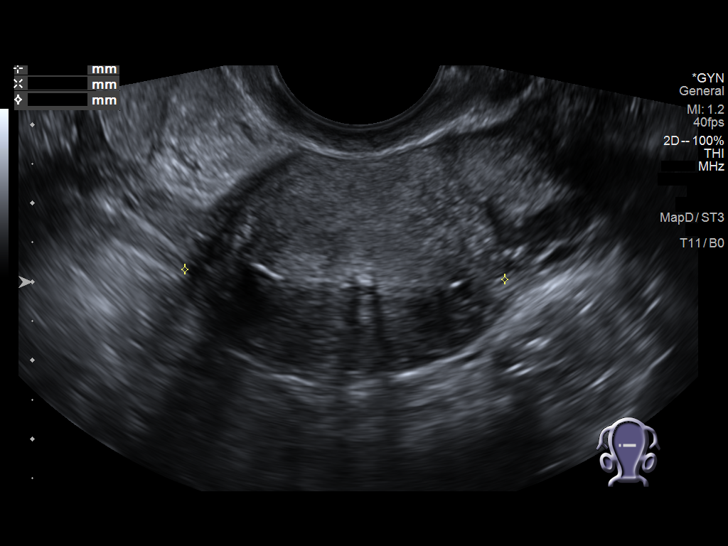
[im 20/60]
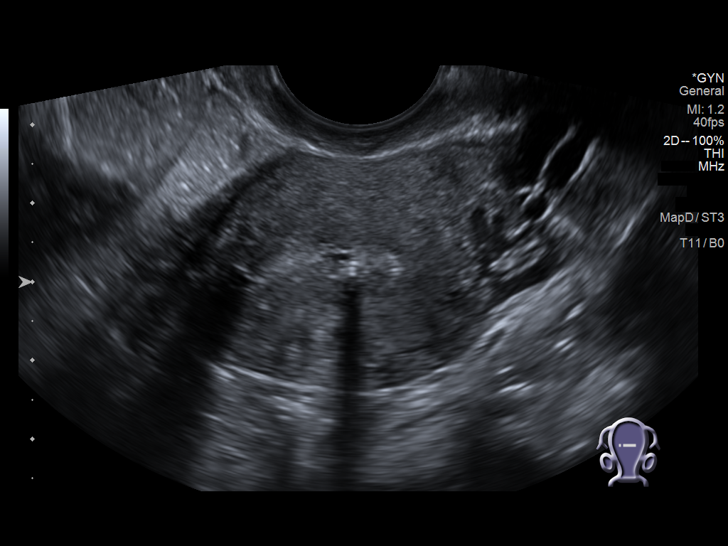
[im 25/60]
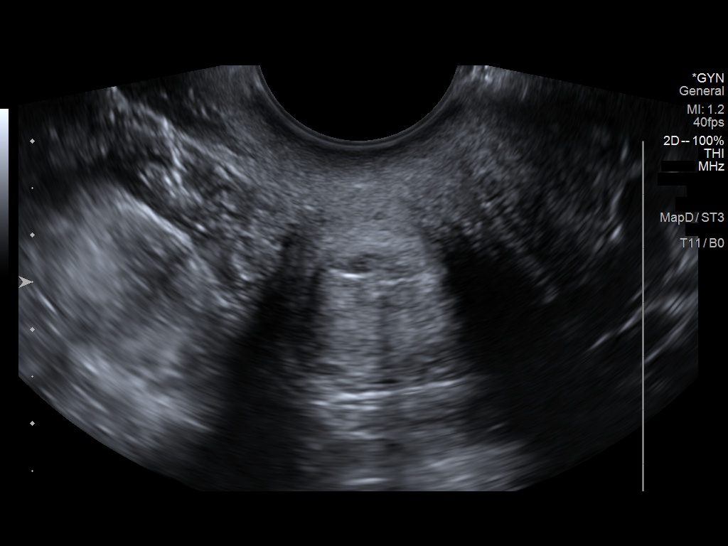
[im 30/60]
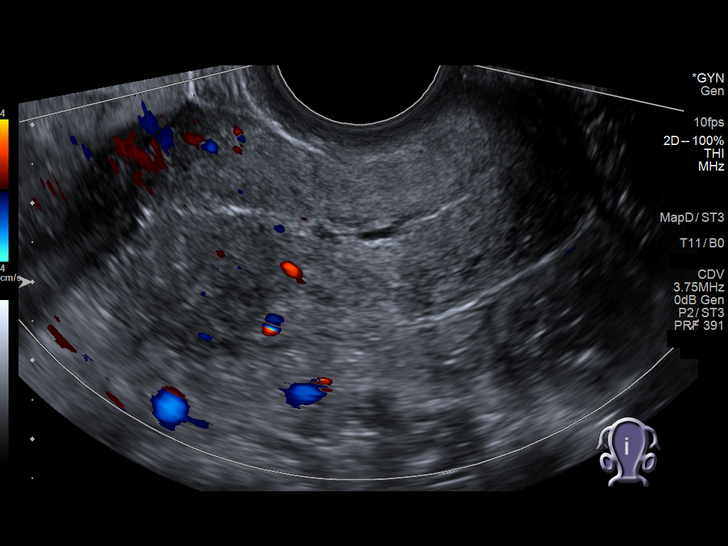
[im 35/60]
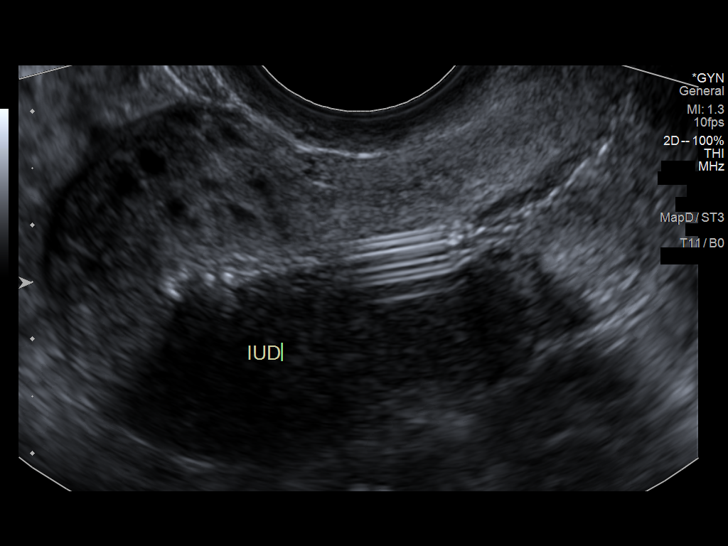
[im 40/60]
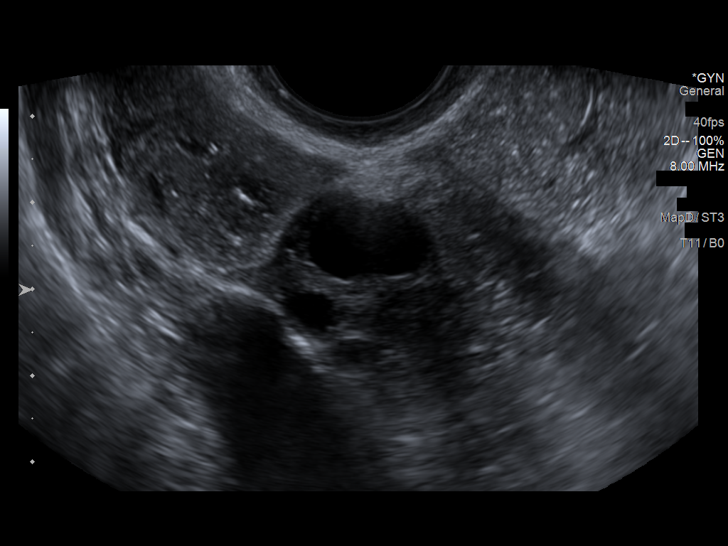
[im 45/60]
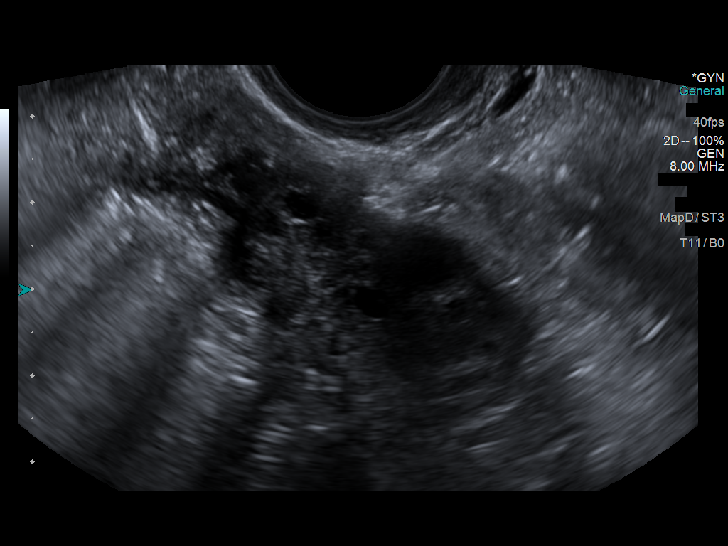
[im 50/60]
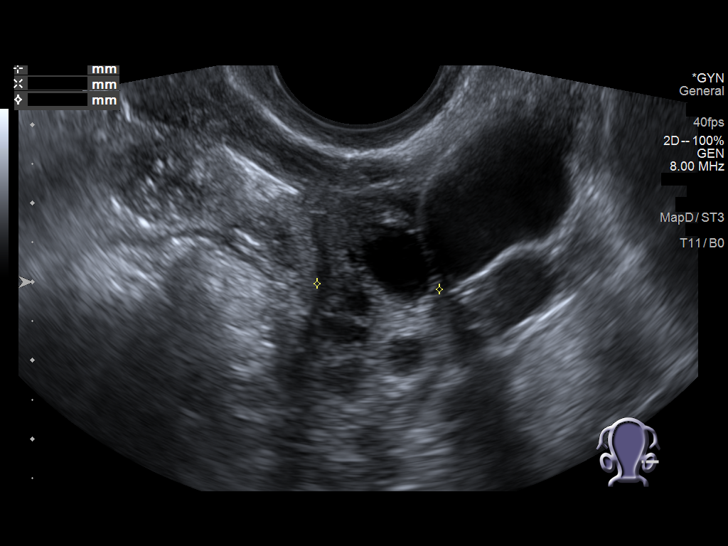
[im 55/60]
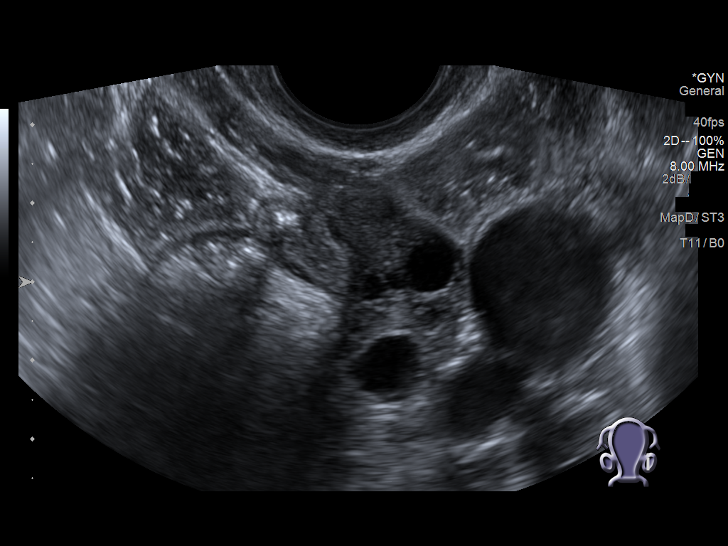
[im 60/60]
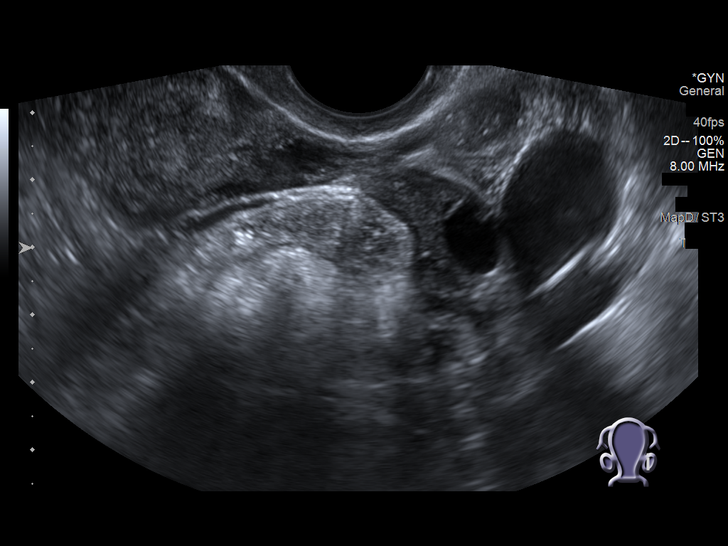

[13 of 25 positions shown; findings below may reference images not displayed]

FINDINGS: Uterus

Measurements: 6.9 x 3.3 x 4.1 cm = volume: 48 mL. Uterus is
anteverted. No fibroids or other mass visualized.

Endometrium

Thickness: 1.6 mm. No focal abnormality visualized. IUD in
appropriate position within the endometrial cavity at the level of
the uterine fundus/body.

Right ovary

Measurements: 3.9 x 2.2 x 1.9 cm = volume: 8.0 mL. Normal
appearance/no adnexal mass.

Left ovary

Measurements: 2.9 x 1.6 x 1.6 cm = volume: 4.0 mL. Normal
appearance/no adnexal mass.

Other findings: Trace free physiologic fluid present within the
pelvis.
IMPRESSION: 1. IUD in appropriate position within the endometrial cavity at the
level of the uterine fundus/body.
2. Endometrial stripe measures 1.6 mm in thickness. If bleeding
remains unresponsive to hormonal or medical therapy, sonohysterogram
should be considered for focal lesion work-up. (Ref: Radiological
Reasoning: Algorithmic Workup of Abnormal Vaginal Bleeding with
Endovaginal Sonography and Sonohysterography. AJR [id number]; [id number])
3. Otherwise unremarkable and normal pelvic ultrasound.

## 2022-11-15 ENCOUNTER — Encounter: Payer: Self-pay | Admitting: Medical-Surgical

## 2022-11-15 ENCOUNTER — Ambulatory Visit (INDEPENDENT_AMBULATORY_CARE_PROVIDER_SITE_OTHER): Payer: No Typology Code available for payment source | Admitting: Medical-Surgical

## 2022-11-15 VITALS — BP 110/68 | HR 107 | Resp 20 | Ht 68.0 in | Wt 120.6 lb

## 2022-11-15 DIAGNOSIS — Z13220 Encounter for screening for lipoid disorders: Secondary | ICD-10-CM

## 2022-11-15 DIAGNOSIS — B070 Plantar wart: Secondary | ICD-10-CM | POA: Diagnosis not present

## 2022-11-15 DIAGNOSIS — Z0000 Encounter for general adult medical examination without abnormal findings: Secondary | ICD-10-CM

## 2022-11-15 LAB — COMPLETE METABOLIC PANEL WITH GFR
ALT: 18 U/L (ref 6–29)
AST: 18 U/L (ref 10–30)
CO2: 29 mmol/L (ref 20–32)
Calcium: 10.2 mg/dL (ref 8.6–10.2)
eGFR: 90 mL/min/{1.73_m2} (ref >=60)

## 2022-11-15 LAB — CBC WITH DIFFERENTIAL/PLATELET
Eosinophils Absolute: 30 cells/uL (ref 15–500)
MPV: 9.5 fL (ref 7.5–12.5)
Monocytes Relative: 7.3 %

## 2022-11-15 LAB — LIPID PANEL: Triglycerides: 84 mg/dL (ref <150)

## 2022-11-15 NOTE — Progress Notes (Signed)
Complete physical exam  Patient: Meghan Delacruz   DOB: 04/04/1986   37 y.o. Female  MRN: [id number]  Subjective:    Chief Complaint  Patient presents with   Annual Exam   Meghan Delacruz is a 37 y.o. female who presents today for a complete physical exam. She reports consuming a general diet.  Doing yoga most days of the week, running 12 miles per week, weight lifting TIW, walking.   She generally feels well. She reports sleeping fairly well. She does not have additional problems to discuss today.    Most recent fall risk assessment:    11/15/2022    1:27 PM  Fall Risk   Falls in the past year? 0  Number falls in past yr: 0  Injury with Fall? 0  Risk for fall due to : No Fall Risks  Follow up Falls evaluation completed     Most recent depression screenings:    11/15/2022    1:27 PM 04/02/2021   11:01 AM  PHQ 2/9 Scores  PHQ - 2 Score 0 2  PHQ- 9 Score  11    Vision:Within last year, Dental: No current dental problems and Receives regular dental care, and STD: The patient denies history of sexually transmitted disease.    Patient Care Team: Samuel Bouche, NP as PCP - General (Nurse Practitioner)   Outpatient Medications Prior to Visit  Medication Sig   ABILIFY 10 MG tablet Take 10 mg by mouth daily.   busPIRone (BUSPAR) 10 MG tablet Take 10 mg by mouth 2 (two) times daily.   levonorgestrel (MIRENA) 20 MCG/DAY IUD 1 each by Intrauterine route once.   WELLBUTRIN XL 300 MG 24 hr tablet Take 300 mg by mouth daily.   [DISCONTINUED] VRAYLAR 1.5 MG capsule Take 1.5 mg by mouth daily.   No facility-administered medications prior to visit.   Review of Systems  Constitutional:  Negative for chills, fever, malaise/fatigue and weight loss.  HENT:  Negative for congestion, ear pain, hearing loss, sinus pain and sore throat.   Eyes:  Negative for blurred vision, photophobia and pain.  Respiratory:  Negative for cough, shortness of breath and wheezing.   Cardiovascular:   Negative for chest pain, palpitations and leg swelling.  Gastrointestinal:  Negative for abdominal pain, constipation, diarrhea, heartburn, nausea and vomiting.  Genitourinary:  Negative for dysuria, frequency and urgency.  Musculoskeletal:  Negative for falls and neck pain.  Skin:  Negative for itching and rash.  Neurological:  Negative for dizziness, weakness and headaches.  Endo/Heme/Allergies:  Negative for polydipsia. Does not bruise/bleed easily.  Psychiatric/Behavioral:  Negative for depression, substance abuse and suicidal ideas. The patient is not nervous/anxious.      Objective:    BP 110/68 (BP Location: Left Arm, Cuff Size: Small)   Pulse (!) 107   Resp 20   Ht '5\' 8"'$  (1.727 m)   Wt 120 lb 9.6 oz (54.7 kg)   SpO2 98%   BMI 18.34 kg/m    Physical Exam Vitals reviewed.  Constitutional:      General: She is not in acute distress.    Appearance: Normal appearance. She is underweight. She is not ill-appearing.  HENT:     Head: Normocephalic and atraumatic.     Right Ear: Tympanic membrane, ear canal and external ear normal. There is no impacted cerumen.     Left Ear: Tympanic membrane, ear canal and external ear normal. There is no impacted cerumen.     Nose: Nose normal. No  congestion or rhinorrhea.     Mouth/Throat:     Mouth: Mucous membranes are moist.     Pharynx: No oropharyngeal exudate or posterior oropharyngeal erythema.  Eyes:     General: No scleral icterus.       Right eye: No discharge.        Left eye: No discharge.     Extraocular Movements: Extraocular movements intact.     Conjunctiva/sclera: Conjunctivae normal.     Pupils: Pupils are equal, round, and reactive to light.  Neck:     Thyroid: No thyromegaly.     Vascular: No carotid bruit or JVD.     Trachea: Trachea normal.  Cardiovascular:     Rate and Rhythm: Normal rate and regular rhythm.     Pulses: Normal pulses.     Heart sounds: Normal heart sounds. No murmur heard.    No friction rub.  No gallop.  Pulmonary:     Effort: Pulmonary effort is normal. No respiratory distress.     Breath sounds: Normal breath sounds. No wheezing.  Abdominal:     General: Bowel sounds are normal. There is no distension.     Palpations: Abdomen is soft.     Tenderness: There is no abdominal tenderness. There is no guarding.  Musculoskeletal:        General: Normal range of motion.     Cervical back: Normal range of motion and neck supple.  Lymphadenopathy:     Cervical: No cervical adenopathy.  Skin:    General: Skin is warm and dry.  Neurological:     Mental Status: She is alert and oriented to person, place, and time.     Cranial Nerves: No cranial nerve deficit.  Psychiatric:        Mood and Affect: Mood normal.        Behavior: Behavior normal.        Thought Content: Thought content normal.        Judgment: Judgment normal.   No results found for any visits on 11/15/22.     Assessment & Plan:    Routine Health Maintenance and Physical Exam  Immunization History  Administered Date(s) Administered   Influenza,inj,Quad PF,6+ Mos 08/23/2013   Moderna Sars-Covid-2 Vaccination 10/10/2019, 11/07/2019   Tdap 12/19/2013    Health Maintenance  Topic Date Due   COVID-19 Vaccine (3 - 2023-24 season) 12/01/2022 (Originally 05/07/2022)   INFLUENZA VACCINE  12/05/2022 (Originally 04/06/2022)   DTaP/Tdap/Td (2 - Td or Tdap) 12/20/2023   PAP SMEAR-Modifier  06/25/2024   HIV Screening  Completed   HPV VACCINES  Aged Out   Hepatitis C Screening  Discontinued    Discussed health benefits of physical activity, and encouraged her to engage in regular exercise appropriate for her age and condition.  1. Annual physical exam Checking labs as below.  Up-to-date on preventative care.  Wellness information provided with AVS. - Lipid panel - COMPLETE METABOLIC PANEL WITH GFR - CBC with Differential/Platelet  2. Lipid screening Checking lipid panel. - Lipid panel  3.  Plantar warts of both  feet Procedure: Cryodestruction of: Multiple plantar warts on the lateral edge of both feet Consent obtained and verified. Time-out conducted. Noted no overlying erythema, induration, or other signs of local infection. Completed without difficulty using Cryo-Gun. Advised to call if fevers/chills, erythema, induration, drainage, or persistent bleeding.  Return in about 1 year (around 11/15/2023) for annual physical exam or sooner if needed.   Samuel Bouche, NP

## 2022-11-16 LAB — COMPLETE METABOLIC PANEL WITH GFR
AG Ratio: 2.3 (calc) (ref 1.0–2.5)
Alkaline phosphatase (APISO): 28 U/L — ABNORMAL LOW (ref 31–125)
BUN: 22 mg/dL (ref 7–25)
Glucose, Bld: 86 mg/dL (ref 65–99)
Potassium: 4.3 mmol/L (ref 3.5–5.3)
Sodium: 140 mmol/L (ref 135–146)
Total Bilirubin: 1.7 mg/dL — ABNORMAL HIGH (ref 0.2–1.2)

## 2022-11-16 LAB — LIPID PANEL
HDL: 67 mg/dL (ref >=50)
LDL Cholesterol (Calc): 94 mg/dL (calc)
Non-HDL Cholesterol (Calc): 111 mg/dL (calc) (ref <130)

## 2022-11-16 LAB — CBC WITH DIFFERENTIAL/PLATELET
Hemoglobin: 15.1 g/dL (ref 11.7–15.5)
MCH: 31.9 pg (ref 27.0–33.0)
MCV: 93.4 fL (ref 80.0–100.0)
Platelets: 311 10*3/uL (ref 140–400)
RDW: 11.7 % (ref 11.0–15.0)
Total Lymphocyte: 19.2 %

## 2022-11-16 NOTE — Progress Notes (Signed)
Added orders  [facility] site - Order number: [id number]

## 2022-11-17 LAB — COMPLETE METABOLIC PANEL WITH GFR
Creat: 0.85 mg/dL (ref 0.50–0.97)
Total Protein: 8 g/dL (ref 6.1–8.1)

## 2022-11-17 LAB — CBC WITH DIFFERENTIAL/PLATELET
Basophils Relative: 1 %
Eosinophils Relative: 0.5 %
HCT: 44.2 % (ref 35.0–45.0)
Lymphs Abs: 1133 cells/uL (ref 850–3900)
MCHC: 34.2 g/dL (ref 32.0–36.0)
RBC: 4.73 10*6/uL (ref 3.80–5.10)

## 2022-11-17 LAB — LIPID PANEL: Total CHOL/HDL Ratio: 2.7 (calc) (ref <5.0)

## 2022-11-18 LAB — BILIRUBIN, FRACTIONATED(TOT/DIR/INDIR)
Bilirubin, Direct: 0.2 mg/dL (ref 0.0–0.2)
Indirect Bilirubin: 1.5 mg/dL (calc) — ABNORMAL HIGH (ref 0.2–1.2)
Total Bilirubin: 1.7 mg/dL — ABNORMAL HIGH (ref 0.2–1.2)

## 2022-11-18 LAB — CBC WITH DIFFERENTIAL/PLATELET
Absolute Monocytes: 431 cells/uL (ref 200–950)
Basophils Absolute: 59 cells/uL (ref 0–200)
Neutro Abs: 4248 cells/uL (ref 1500–7800)
Neutrophils Relative %: 72 %
WBC: 5.9 10*3/uL (ref 3.8–10.8)

## 2022-11-18 LAB — COMPLETE METABOLIC PANEL WITH GFR
Albumin: 5.6 g/dL — ABNORMAL HIGH (ref 3.6–5.1)
Chloride: 101 mmol/L (ref 98–110)
Globulin: 2.4 g/dL (calc) (ref 1.9–3.7)

## 2022-11-18 LAB — LIPID PANEL: Cholesterol: 178 mg/dL (ref <200)

## 2022-11-18 LAB — RETICULOCYTES

## 2022-11-19 ENCOUNTER — Encounter: Payer: Self-pay | Admitting: Medical-Surgical

## 2022-11-22 ENCOUNTER — Encounter: Payer: Self-pay | Admitting: Medical-Surgical

## 2023-07-11 ENCOUNTER — Encounter: Payer: Self-pay | Admitting: Medical-Surgical

## 2023-07-14 ENCOUNTER — Ambulatory Visit: Payer: No Typology Code available for payment source | Admitting: Medical-Surgical

## 2023-07-23 IMAGING — US US PELVIS COMPLETE WITH TRANSVAGINAL
1 series · 13 of 25 positions shown · non-contrast
Comparison: Prior ultrasound from 10/13/2020.

CLINICAL DATA: Initial evaluation for left-sided pelvic pain,
increased breakthrough bleeding with Mirena IUD in place.



[Series 1: us pelvic complete with transvaginal · 13 of 98 slices shown]
[im 1/98]
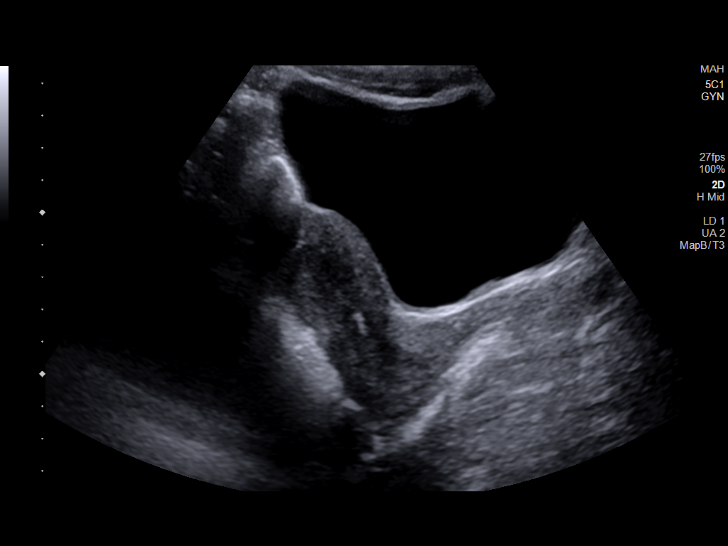
[im 9/98]
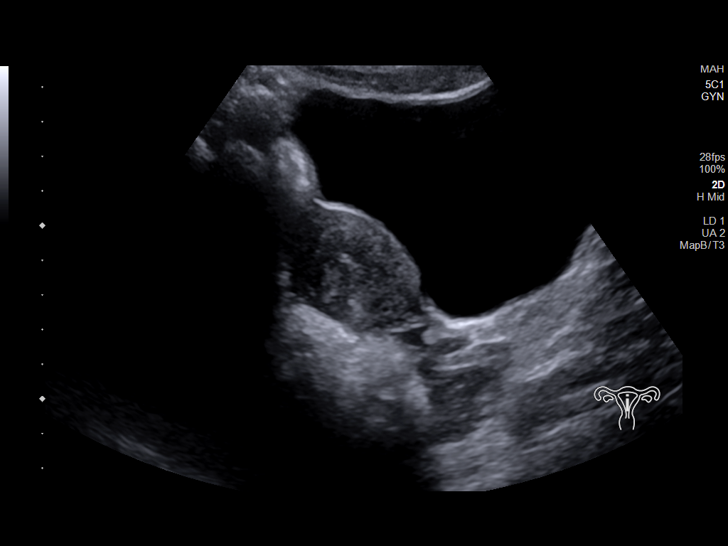
[im 17/98]
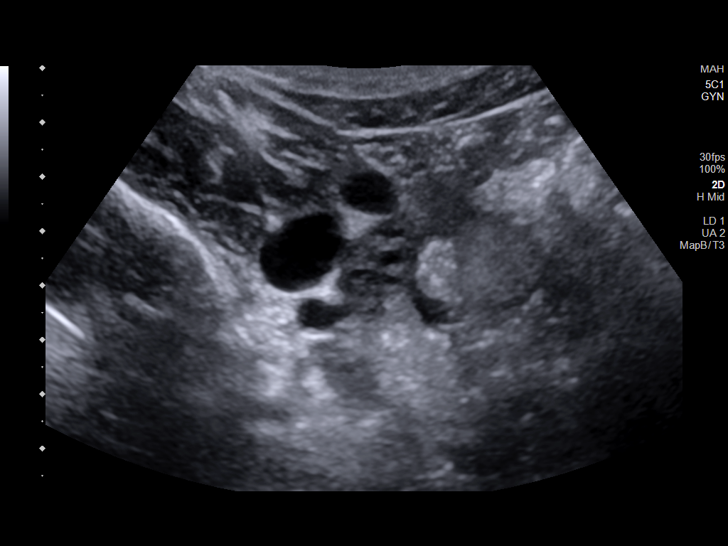
[im 25/98]
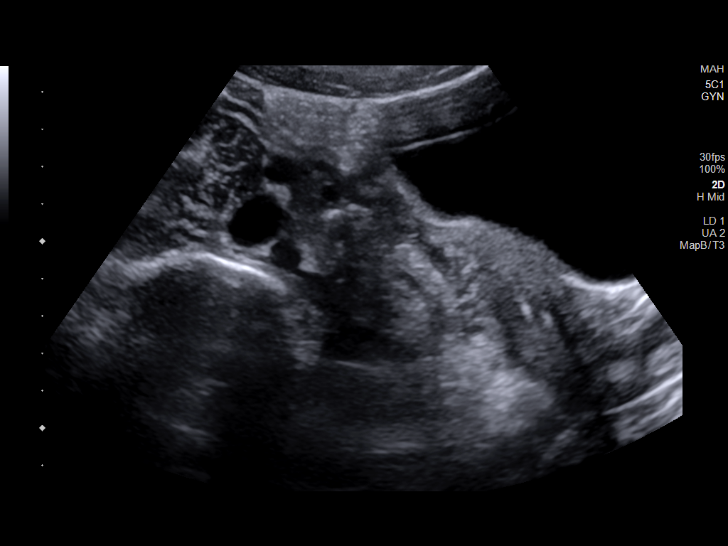
[im 33/98]
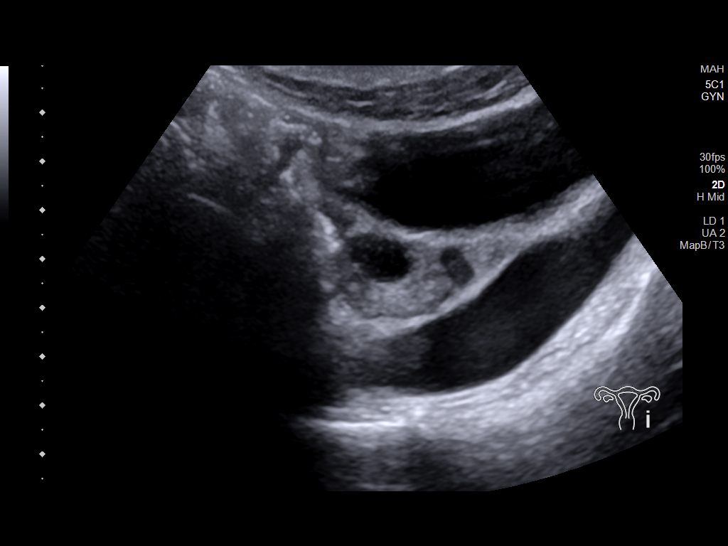
[im 41/98]
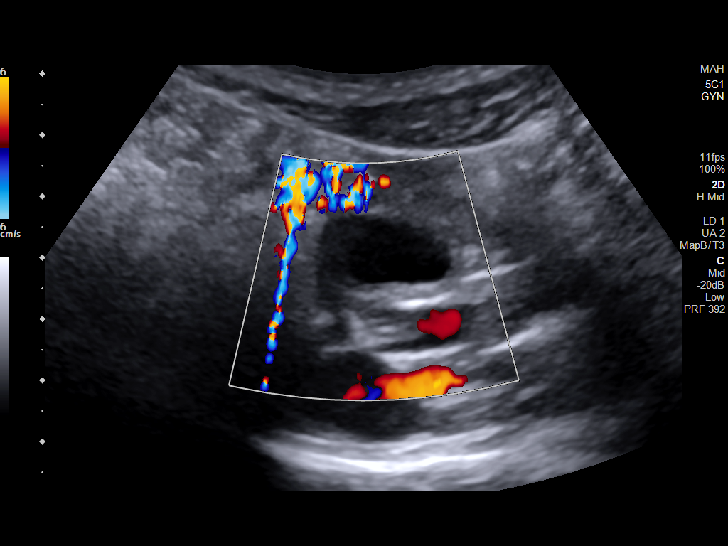
[im 49/98]
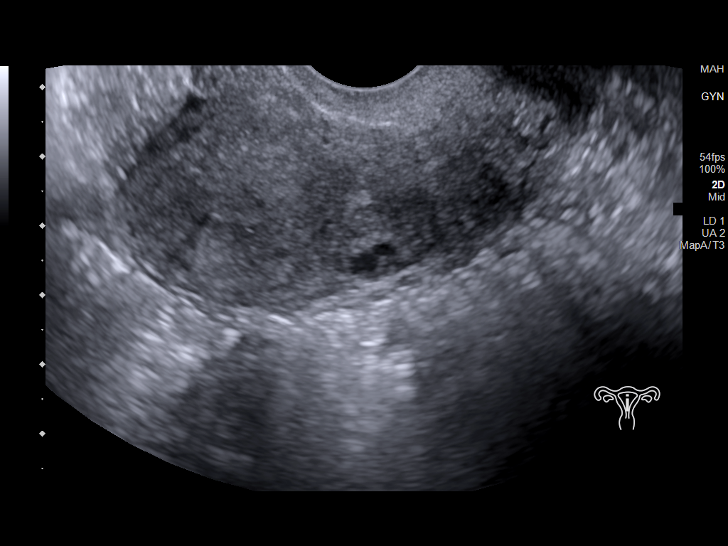
[im 57/98]
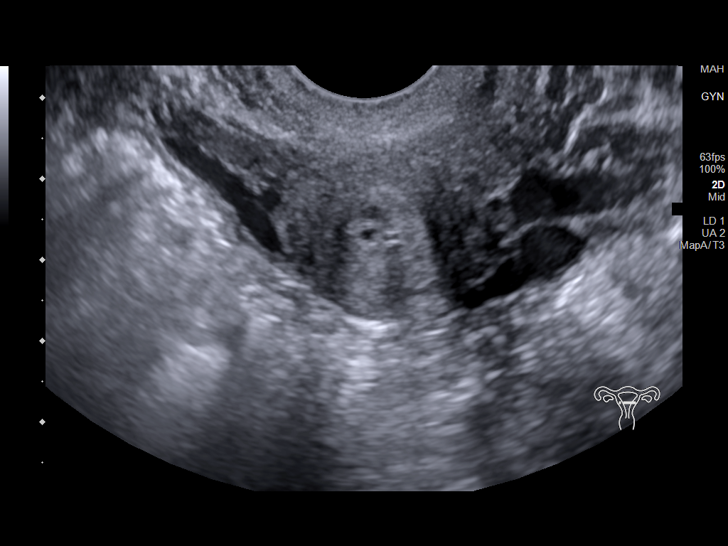
[im 65/98]
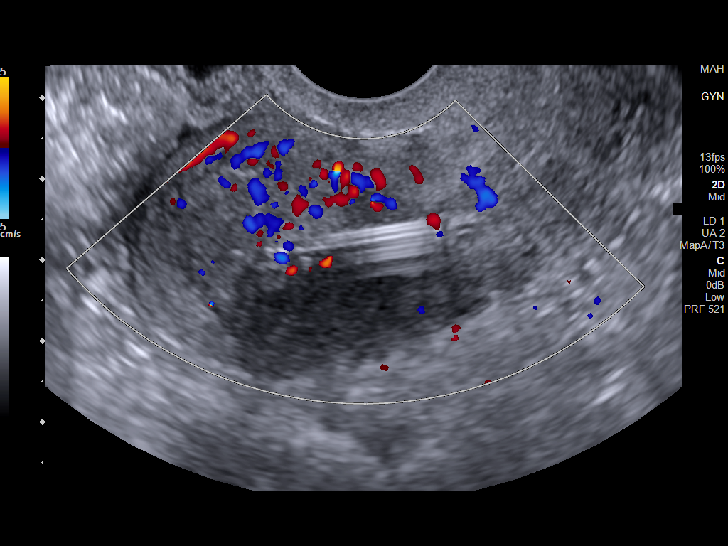
[im 73/98]
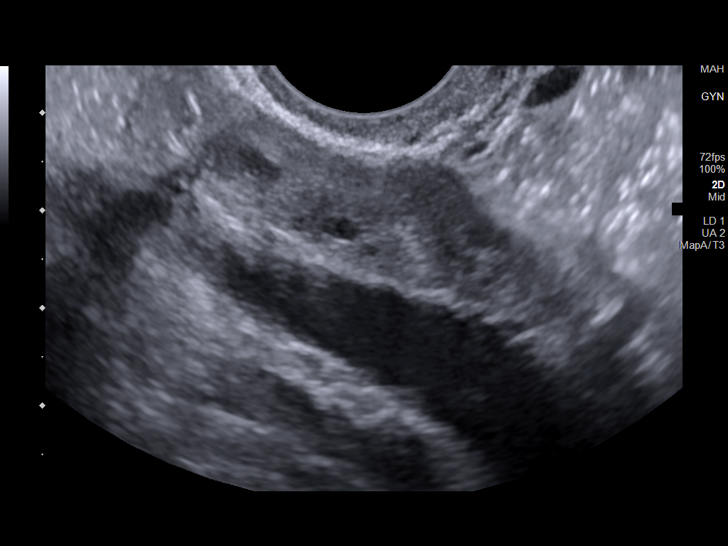
[im 81/98]
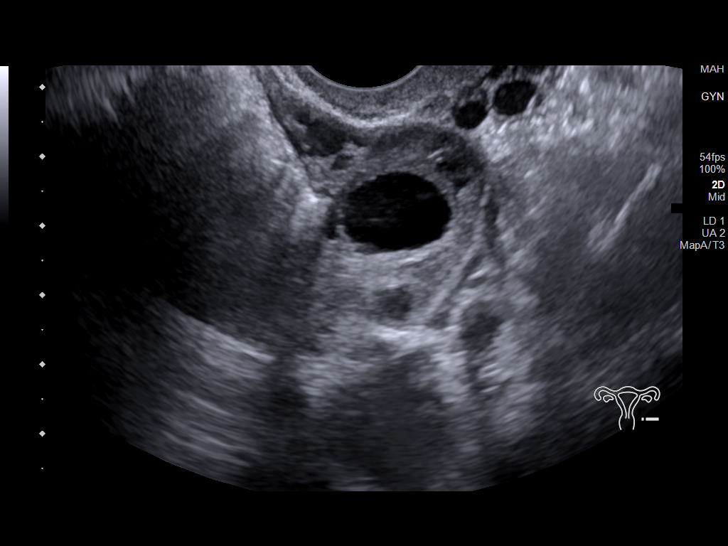
[im 89/98]
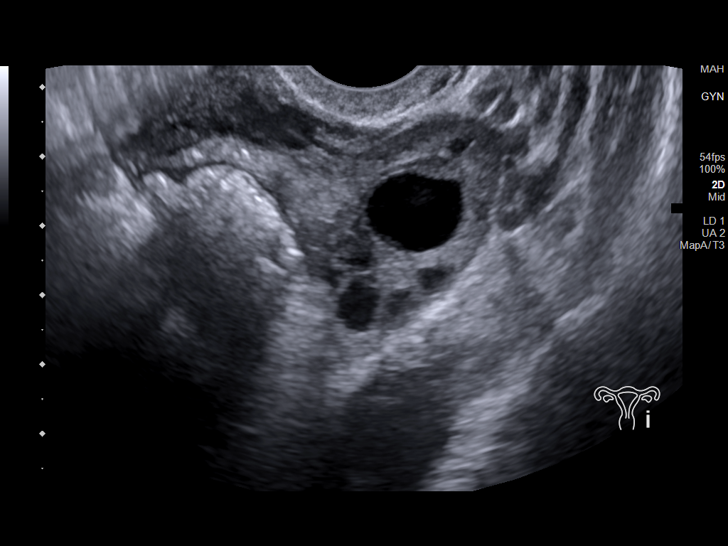
[im 98/98]
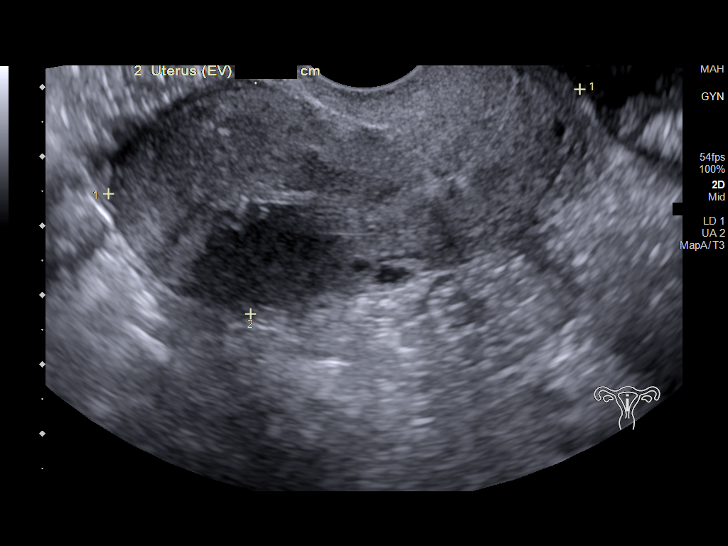

[13 of 25 positions shown; findings below may reference images not displayed]

FINDINGS: Uterus

Measurements: 7.1 x 3.1 x 4.6 cm = volume: 51.9 mL. Uterus is
anteverted. No discrete fibroid or other mass.

Endometrium

IUD in appropriate position within the endometrial cavity at the
level of the uterine fundus/body. Endometrial complex poorly
visualized due to adjacent IUD.

Right ovary

Measurements: 3.5 x 1.2 x 2.0 cm = volume: 4.2 mL. Normal
appearance/no adnexal mass.

Left ovary

Measurements: 3.3 x 1.9 x 2.0 cm = volume: 6.5 mL. 1.5 cm dominant
follicle noted. No other adnexal mass.

Other findings

No abnormal free fluid.
IMPRESSION: 1. 1.5 cm dominant left ovarian follicle, which could contribute to
acute left-sided pelvic pain. Otherwise normal appearance of the
ovaries with no other adnexal mass or other acute abnormality within
the pelvis.
2. IUD in appropriate position within the endometrial cavity at the
level of the uterine fundus/body. Endometrial stripe not well
assessed due to adjacent IUD.
3. Normal sonographic appearance of the uterus.

## 2023-12-12 ENCOUNTER — Encounter: Admitting: Medical-Surgical

## 2024-01-03 ENCOUNTER — Encounter: Payer: Self-pay | Admitting: Medical-Surgical

## 2024-01-03 ENCOUNTER — Ambulatory Visit (INDEPENDENT_AMBULATORY_CARE_PROVIDER_SITE_OTHER): Admitting: Medical-Surgical

## 2024-01-03 VITALS — BP 125/87 | HR 91 | Resp 20 | Ht 68.0 in | Wt 146.7 lb

## 2024-01-03 DIAGNOSIS — Z131 Encounter for screening for diabetes mellitus: Secondary | ICD-10-CM

## 2024-01-03 DIAGNOSIS — Z1329 Encounter for screening for other suspected endocrine disorder: Secondary | ICD-10-CM

## 2024-01-03 DIAGNOSIS — F5002 Anorexia nervosa, binge eating/purging type, mild: Secondary | ICD-10-CM | POA: Insufficient documentation

## 2024-01-03 DIAGNOSIS — Z23 Encounter for immunization: Secondary | ICD-10-CM

## 2024-01-03 DIAGNOSIS — E559 Vitamin D deficiency, unspecified: Secondary | ICD-10-CM | POA: Insufficient documentation

## 2024-01-03 DIAGNOSIS — Z0000 Encounter for general adult medical examination without abnormal findings: Secondary | ICD-10-CM | POA: Diagnosis not present

## 2024-01-03 DIAGNOSIS — Z13220 Encounter for screening for lipoid disorders: Secondary | ICD-10-CM | POA: Diagnosis not present

## 2024-01-03 NOTE — Patient Instructions (Signed)

## 2024-01-03 NOTE — Progress Notes (Signed)
 Complete physical exam  Patient: Meghan Delacruz   DOB: 23-Jan-1986   38 y.o. Female  MRN: [id number]  Subjective:    Chief Complaint  Patient presents with   Annual Exam   Eating Disorder    Meghan Delacruz is a 38 y.o. female who presents today for a complete physical exam. She reports consuming a general diet.  Currently on an exercise restriction for 1 hour maximum activity.  She generally feels fairly well. She reports sleeping well. She does not have additional problems to discuss today.    Most recent fall risk assessment:    11/15/2022    1:27 PM  Fall Risk   Falls in the past year? 0  Number falls in past yr: 0  Injury with Fall? 0  Risk for fall due to : No Fall Risks  Follow up Falls evaluation completed     Most recent depression screenings:    01/03/2024    8:26 AM 11/15/2022    1:27 PM  PHQ 2/9 Scores  PHQ - 2 Score 0 0    Vision:Within last year and Dental: No current dental problems and Receives regular dental care    Patient Care Team: Cherre Cornish, NP as PCP - General (Nurse Practitioner)   Outpatient Medications Prior to Visit  Medication Sig   ABILIFY 10 MG tablet Take 10 mg by mouth daily.   busPIRone (BUSPAR) 10 MG tablet Take 10 mg by mouth 2 (two) times daily.   desvenlafaxine (PRISTIQ) 100 MG 24 hr tablet Take 100 mg by mouth daily.   levonorgestrel  (MIRENA ) 20 MCG/DAY IUD 1 each by Intrauterine route once.   [DISCONTINUED] WELLBUTRIN  XL 300 MG 24 hr tablet Take 300 mg by mouth daily.   No facility-administered medications prior to visit.    Review of Systems  Constitutional:  Negative for chills, fever, malaise/fatigue and weight loss.  HENT:  Negative for congestion, ear pain, hearing loss, sinus pain and sore throat.   Eyes:  Negative for blurred vision, photophobia and pain.  Respiratory:  Negative for cough, shortness of breath and wheezing.   Cardiovascular:  Negative for chest pain, palpitations and leg swelling.   Gastrointestinal:  Negative for abdominal pain, constipation, diarrhea, heartburn, nausea and vomiting.  Genitourinary:  Negative for dysuria, frequency and urgency.  Musculoskeletal:  Negative for falls and neck pain.  Skin:  Negative for itching and rash.  Neurological:  Negative for dizziness, weakness and headaches.  Endo/Heme/Allergies:  Negative for polydipsia. Does not bruise/bleed easily.  Psychiatric/Behavioral:  Positive for depression. Negative for substance abuse and suicidal ideas. The patient is nervous/anxious. The patient does not have insomnia.      Objective:    BP 117/76 (BP Location: Right Arm, Cuff Size: Small)   Pulse 90   Resp 20   Ht 5\' 8"  (1.727 m)   Wt 146 lb 11.2 oz (66.5 kg)   SpO2 100%   Breastfeeding No   BMI 22.31 kg/m    Physical Exam Constitutional:      General: She is not in acute distress.    Appearance: Normal appearance. She is not ill-appearing.  HENT:     Head: Normocephalic and atraumatic.     Right Ear: Tympanic membrane, ear canal and external ear normal. There is no impacted cerumen.     Left Ear: Tympanic membrane, ear canal and external ear normal. There is no impacted cerumen.     Nose: Nose normal. No congestion or rhinorrhea.     Mouth/Throat:  Mouth: Mucous membranes are moist.     Pharynx: No oropharyngeal exudate or posterior oropharyngeal erythema.  Eyes:     General: No scleral icterus.       Right eye: No discharge.        Left eye: No discharge.     Extraocular Movements: Extraocular movements intact.     Conjunctiva/sclera: Conjunctivae normal.     Pupils: Pupils are equal, round, and reactive to light.  Neck:     Thyroid: No thyromegaly.     Vascular: No carotid bruit or JVD.     Trachea: Trachea normal.  Cardiovascular:     Rate and Rhythm: Normal rate and regular rhythm.     Pulses: Normal pulses.     Heart sounds: Normal heart sounds. No murmur heard.    No friction rub. No gallop.  Pulmonary:      Effort: Pulmonary effort is normal. No respiratory distress.     Breath sounds: Normal breath sounds. No wheezing.  Abdominal:     General: Bowel sounds are normal. There is no distension.     Palpations: Abdomen is soft.     Tenderness: There is no abdominal tenderness. There is no guarding.  Musculoskeletal:        General: Normal range of motion.     Cervical back: Normal range of motion and neck supple.  Lymphadenopathy:     Cervical: No cervical adenopathy.  Skin:    General: Skin is warm and dry.  Neurological:     Mental Status: She is alert and oriented to person, place, and time.     Cranial Nerves: No cranial nerve deficit.  Psychiatric:        Mood and Affect: Mood normal.        Behavior: Behavior normal.        Thought Content: Thought content normal.        Judgment: Judgment normal.    No results found for any visits on 01/03/24.     Assessment & Plan:    Routine Health Maintenance and Physical Exam  Immunization History  Administered Date(s) Administered   Influenza,inj,Quad PF,6+ Mos 08/23/2013   Moderna Sars-Covid-2 Vaccination 10/10/2019, 11/07/2019   Tdap 12/19/2013, 01/03/2024    Health Maintenance  Topic Date Due   COVID-19 Vaccine (3 - 2024-25 season) 01/19/2024 (Originally 05/08/2023)   INFLUENZA VACCINE  04/06/2024   Cervical Cancer Screening (HPV/Pap Cotest)  06/25/2026   DTaP/Tdap/Td (3 - Td or Tdap) 01/02/2034   HIV Screening  Completed   HPV VACCINES  Aged Out   Meningococcal B Vaccine  Aged Out   Hepatitis C Screening  Discontinued    Discussed health benefits of physical activity, and encouraged her to engage in regular exercise appropriate for her age and condition.  1. Annual physical exam (Primary) Checking labs below.  Up-to-date on preventative care.  Wellness information provided with AVS. - CBC with Differential/Platelet - CMP14+EGFR - Lipid panel  2. Lipid screening Checking lipids today. - Lipid panel  3. Diabetes  mellitus screening Checking A1c. - Hemoglobin A1c  4. Thyroid disorder screen Checking TSH. - TSH  5. Anorexia nervosa, binge-eating purging type, mild Currently working with psychiatry and counseling for her eating disorder.  Has been working with them for the past 3 weeks.  We have paperwork on hand requesting labs to be drawn including those below.  Once results are available and note is finished, we will send the completed form along with office notes and results via  fax. - Phosphorus - Magnesium - Iron, TIBC and Ferritin Panel  6. Vitamin D deficiency Checking vitamin D. - VITAMIN D 25 Hydroxy (Vit-D Deficiency, Fractures)  7. Need for tetanus booster Tdap given in office today. - Tdap vaccine greater than or equal to 7yo IM  Return in about 1 year (around 01/02/2025) for annual physical exam.   Cherre Cornish, NP

## 2024-01-04 ENCOUNTER — Encounter: Payer: Self-pay | Admitting: Medical-Surgical

## 2024-01-04 LAB — CBC WITH DIFFERENTIAL/PLATELET
Basophils Absolute: 0.1 10*3/uL (ref 0.0–0.2)
Basos: 1 %
EOS (ABSOLUTE): 0.1 10*3/uL (ref 0.0–0.4)
Eos: 2 %
Hematocrit: 42.7 % (ref 34.0–46.6)
Hemoglobin: 14.1 g/dL (ref 11.1–15.9)
Immature Grans (Abs): 0 10*3/uL (ref 0.0–0.1)
Immature Granulocytes: 1 %
Lymphocytes Absolute: 1.4 10*3/uL (ref 0.7–3.1)
Lymphs: 26 %
MCH: 31.4 pg (ref 26.6–33.0)
MCHC: 33 g/dL (ref 31.5–35.7)
MCV: 95 fL (ref 79–97)
Monocytes Absolute: 0.4 10*3/uL (ref 0.1–0.9)
Monocytes: 7 %
Neutrophils Absolute: 3.5 10*3/uL (ref 1.4–7.0)
Neutrophils: 63 %
Platelets: 326 10*3/uL (ref 150–450)
RBC: 4.49 x10E6/uL (ref 3.77–5.28)
RDW: 11.7 % (ref 11.7–15.4)
WBC: 5.6 10*3/uL (ref 3.4–10.8)

## 2024-01-04 LAB — CMP14+EGFR
ALT: 29 IU/L (ref 0–32)
AST: 22 IU/L (ref 0–40)
Albumin: 4.4 g/dL (ref 3.9–4.9)
Alkaline Phosphatase: 37 IU/L — ABNORMAL LOW (ref 44–121)
BUN/Creatinine Ratio: 19 (ref 9–23)
BUN: 15 mg/dL (ref 6–20)
Bilirubin Total: 0.6 mg/dL (ref 0.0–1.2)
CO2: 24 mmol/L (ref 20–29)
Calcium: 9.8 mg/dL (ref 8.7–10.2)
Chloride: 102 mmol/L (ref 96–106)
Creatinine, Ser: 0.77 mg/dL (ref 0.57–1.00)
Globulin, Total: 2.6 g/dL (ref 1.5–4.5)
Glucose: 96 mg/dL (ref 70–99)
Potassium: 4.7 mmol/L (ref 3.5–5.2)
Sodium: 140 mmol/L (ref 134–144)
Total Protein: 7 g/dL (ref 6.0–8.5)
eGFR: 101 mL/min/{1.73_m2} (ref >59)

## 2024-01-04 LAB — LIPID PANEL
Chol/HDL Ratio: 2.7 ratio (ref 0.0–4.4)
Cholesterol, Total: 166 mg/dL (ref 100–199)
HDL: 62 mg/dL (ref >39)
LDL Chol Calc (NIH): 93 mg/dL (ref 0–99)
Triglycerides: 52 mg/dL (ref 0–149)
VLDL Cholesterol Cal: 11 mg/dL (ref 5–40)

## 2024-01-04 LAB — HEMOGLOBIN A1C
Est. average glucose Bld gHb Est-mCnc: 103 mg/dL
Hgb A1c MFr Bld: 5.2 % (ref 4.8–5.6)

## 2024-01-04 LAB — IRON,TIBC AND FERRITIN PANEL
Ferritin: 42 ng/mL (ref 15–150)
Iron Saturation: 38 % (ref 15–55)
Iron: 134 ug/dL (ref 27–159)
Total Iron Binding Capacity: 354 ug/dL (ref 250–450)
UIBC: 220 ug/dL (ref 131–425)

## 2024-01-04 LAB — TSH: TSH: 1.55 u[IU]/mL (ref 0.450–4.500)

## 2024-01-04 LAB — VITAMIN D 25 HYDROXY (VIT D DEFICIENCY, FRACTURES): Vit D, 25-Hydroxy: 41.6 ng/mL (ref 30.0–100.0)

## 2024-01-04 LAB — PHOSPHORUS: Phosphorus: 3.6 mg/dL (ref 3.0–4.3)

## 2024-01-04 LAB — MAGNESIUM: Magnesium: 2.1 mg/dL (ref 1.6–2.3)
